# Patient Record
Sex: Male | Born: 1960 | Race: Black or African American | Hispanic: No | Marital: Married | Smoking: Never smoker
Health system: Southern US, Community
[De-identification: ages and names within clinical notes are randomized; demographics above are authoritative.]

## PROBLEM LIST (undated history)

## (undated) DIAGNOSIS — R42 Dizziness and giddiness: Secondary | ICD-10-CM

## (undated) DIAGNOSIS — E119 Type 2 diabetes mellitus without complications: Secondary | ICD-10-CM

## (undated) DIAGNOSIS — M791 Myalgia, unspecified site: Secondary | ICD-10-CM

## (undated) HISTORY — DX: Myalgia, unspecified site: M79.10

## (undated) HISTORY — DX: Dizziness and giddiness: R42

## (undated) HISTORY — PX: ACHILLES TENDON REPAIR: SUR1153

---

## 2017-04-03 ENCOUNTER — Emergency Department (HOSPITAL_COMMUNITY): Payer: POS

## 2017-04-03 ENCOUNTER — Emergency Department (HOSPITAL_COMMUNITY)
Admission: EM | Admit: 2017-04-03 | Discharge: 2017-04-04 | Disposition: A | Discharge status: Home / Self Care | Payer: POS | Attending: Emergency Medicine | Admitting: Emergency Medicine

## 2017-04-03 ENCOUNTER — Encounter (HOSPITAL_COMMUNITY): Payer: Self-pay | Admitting: Emergency Medicine

## 2017-04-03 DIAGNOSIS — E119 Type 2 diabetes mellitus without complications: Secondary | ICD-10-CM | POA: Insufficient documentation

## 2017-04-03 DIAGNOSIS — F319 Bipolar disorder, unspecified: Secondary | ICD-10-CM | POA: Diagnosis not present

## 2017-04-03 DIAGNOSIS — F22 Delusional disorders: Secondary | ICD-10-CM | POA: Diagnosis not present

## 2017-04-03 HISTORY — DX: Dizziness and giddiness: R42

## 2017-04-03 HISTORY — DX: Type 2 diabetes mellitus without complications: E11.9

## 2017-04-03 LAB — CBC WITH DIFFERENTIAL/PLATELET
BASOS PCT: 0 %
Basophils Absolute: 0 10*3/uL (ref 0.0–0.1)
EOS ABS: 0.1 10*3/uL (ref 0.0–0.7)
EOS PCT: 1 %
HCT: 41.4 % (ref 39.0–52.0)
HEMOGLOBIN: 13.8 g/dL (ref 13.0–17.0)
LYMPHS ABS: 2.6 10*3/uL (ref 0.7–4.0)
Lymphocytes Relative: 32 %
MCH: 29 pg (ref 26.0–34.0)
MCHC: 33.3 g/dL (ref 30.0–36.0)
MCV: 87 fL (ref 78.0–100.0)
MONO ABS: 0.5 10*3/uL (ref 0.1–1.0)
MONOS PCT: 6 %
Neutro Abs: 4.9 10*3/uL (ref 1.7–7.7)
Neutrophils Relative %: 61 %
Platelets: 344 10*3/uL (ref 150–400)
RBC: 4.76 MIL/uL (ref 4.22–5.81)
RDW: 12.6 % (ref 11.5–15.5)
WBC: 8 10*3/uL (ref 4.0–10.5)

## 2017-04-03 LAB — COMPREHENSIVE METABOLIC PANEL
ALK PHOS: 57 U/L (ref 38–126)
ALT: 29 U/L (ref 17–63)
ANION GAP: 8 (ref 5–15)
AST: 20 U/L (ref 15–41)
Albumin: 3.7 g/dL (ref 3.5–5.0)
BILIRUBIN TOTAL: 0.8 mg/dL (ref 0.3–1.2)
BUN: 18 mg/dL (ref 6–20)
CALCIUM: 8.7 mg/dL — AB (ref 8.9–10.3)
CO2: 27 mmol/L (ref 22–32)
CREATININE: 1.03 mg/dL (ref 0.61–1.24)
Chloride: 101 mmol/L (ref 101–111)
GFR calc Af Amer: >60 mL/min (ref >60)
GFR calc non Af Amer: >60 mL/min (ref >60)
Glucose, Bld: 244 mg/dL — ABNORMAL HIGH (ref 65–99)
Potassium: 3.7 mmol/L (ref 3.5–5.1)
SODIUM: 136 mmol/L (ref 135–145)
TOTAL PROTEIN: 7.2 g/dL (ref 6.5–8.1)

## 2017-04-03 LAB — CBG MONITORING, ED: Glucose-Capillary: 185 mg/dL — ABNORMAL HIGH (ref 65–99)

## 2017-04-03 LAB — AMMONIA: AMMONIA: 24 umol/L (ref 9–35)

## 2017-04-03 LAB — ETHANOL: Alcohol, Ethyl (B): <10 mg/dL (ref <10)

## 2017-04-03 MED ORDER — ALUM & MAG HYDROXIDE-SIMETH 200-200-20 MG/5ML PO SUSP: 30.0000 mL | Freq: Four times a day (QID) | ORAL | Status: DC | PRN

## 2017-04-03 MED ORDER — ZOLPIDEM TARTRATE 5 MG PO TABS: 5.0000 mg | ORAL_TABLET | Freq: Every evening | ORAL | Status: DC | PRN

## 2017-04-03 MED ORDER — ACETAMINOPHEN 325 MG PO TABS: 650.0000 mg | ORAL_TABLET | ORAL | Status: DC | PRN

## 2017-04-03 MED ORDER — ONDANSETRON HCL 4 MG PO TABS: 4.0000 mg | ORAL_TABLET | Freq: Three times a day (TID) | ORAL | Status: DC | PRN

## 2017-04-03 NOTE — ED Notes (Signed)
Pharm Tech at bedside to review medications.

## 2017-04-03 NOTE — BH Assessment (Addendum)
**Note De-Identified Rangel Obfuscation** Tele Assessment Note   Patient Name: George GingerRonald Strout Sr. MRN: [id number] Referring Physician: Arthor CaptainAbigail Harris PA-C Location of [facility]: [facility]  George Gingeronald Marques Sr. is an 56 y.o. male. [facility] He is pleasant and oriented x 4. He says he and his fiancee drove around today trying to get him assessed and someone [facility]. He denies hx of outpatient or inpatient MH treatment. Pt sts he was recently prescribed psych meds by his PCP, [facility] at [facility]. He says he spoke w/ a George Rangel at the post office who is "like a Public relations account executiveguidance counselor". Pt reports lately he has been hearing air blowing in his L ear and hearing whistling. He says when he tries to read, his mind wanders which is a fairly new phenomenon. 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 He says there were kids in the street and angry men. He says that there were people with [place] sports team shirts (pt lived in [place] for 15 yrs) and he was wondering if they were trying to tell him something. Pt says he wasn't sure if what he was seeing was real. He says that lately landmarks are not where they typically are on his route. He says he wonders if the post office is out to get him. He says "strange things have been happening lately." Pt says that an old lady has been giving him candy and a few male coworkers have been patting his back and asking about his weight loss. (Pt says he has lost 24 lbs in 4 weeks b/c when he is upset, he doesn't eat). He says his coworker told him that all pt's talking is bothering the other workers, and pt is worried that he is bothering them. Pt reports depressed mood but only endorses guilt. He says he feels guilt for leaving his wife and moving to [place] 6 years ago. He says his kids tell him that he is a bad father.  Pt reports his 718 yo son told him that he needs $8,000 for next semester at [employer] He says he  was dx with diabetes approx one month ago. Pt says his sister who is [id number] has been hearing voices for the past 15 yrs. Pt denies SI currently or at any time in the past. Pt denies any history of suicide attempts and denies history of self-mutilation. Pt denies hallucinations. Pt does not appear to be responding to internal stimuli. [name] reality testing appears to be intact. Pt denies homicidal thoughts or physical aggression. Pt denies having access to firearms. Pt denies having any legal problems at this time. Pt denies any current or past substance abuse problems. Pt does not appear to be intoxicated or in withdrawal at this time.   Diagnosis: Mild Neurocognitive Disorder  Past Medical History:  Past Medical History:  Diagnosis Date  . Diabetes mellitus without complication (HCC)   . Vertigo     History reviewed. No pertinent surgical history.  Family History: No family history on file.  Social History:  reports that  has never smoked. he has never used smokeless tobacco. He reports that he does not drink alcohol or use drugs.  Additional Social History:  Alcohol / Drug Use Pain Medications: pt denies abuse - see pta meds lsit Prescriptions: pt denies abuse - see pta meds list Over the Counter: pt denies abuse - see pta meds list History  of alcohol / drug use?: No history of alcohol / drug abuse Longest period of sobriety (when/how long): n/a  CIWA: CIWA-Ar BP: 137/71 Pulse Rate: 67 COWS:    PATIENT STRENGTHS: (choose at least two) Average or above average intelligence Capable of independent living Communication skills Motivation for treatment/growth Physical Health Supportive family/friends Work skills  Allergies: No Known Allergies  Home Medications:  (Not in a hospital admission)  OB/GYN Status:  No LMP for male patient.  General Assessment Data [facility] of Assessment: [facility] Assessment: In system Is this a Tele or Face-to-Face Assessment?: Tele Assessment Is  this an Initial Assessment or a Re-assessment for this encounter?: Initial Assessment Marital status: (fiancee) Maiden name: none Is patient pregnant?: No Pregnancy Status: No Living Arrangements: Children, Spouse/significant other(fiancee, 40 yo daughter) Can pt return to current living arrangement?: Yes Admission Status: Voluntary Is patient capable of signing voluntary admission?: Yes Referral Source: Self/Family/Friend Insurance type: self pay     Crisis Care Plan Living Arrangements: Children, Spouse/significant other(fiancee, 92 yo daughter) Legal Guardian: (patient) Name of Psychiatrist: none Name of Therapist: none  Education Status Is patient currently in school?: No  Risk to self with the past 6 months Suicidal Ideation: No Has patient been a risk to self within the past 6 months prior to admission? : No Suicidal Intent: No Has patient had any suicidal intent within the past 6 months prior to admission? : No Is patient at risk for suicide?: No Suicidal Plan?: No Has patient had any suicidal plan within the past 6 months prior to admission? : No Access to Means: No What has been your use of drugs/alcohol within the last 12 months?: none Previous Attempts/Gestures: No How many times?: 0 Other Self Harm Risks: none Triggers for Past Attempts: (n/a) Intentional Self Injurious Behavior: None Family Suicide History: No Recent stressful life event(s): Other (Comment), Financial Problems(work, need $8000 to pay pt's next semester) Persecutory voices/beliefs?: No Depression: No Depression Symptoms: Guilt Substance abuse history and/or treatment for substance abuse?: No Suicide prevention information given to non-admitted patients: Not applicable  Risk to Others within the past 6 months Homicidal Ideation: No Does patient have any lifetime risk of violence toward others beyond the six months prior to admission? : No Thoughts of Harm to Others: No Current Homicidal  Intent: No Current Homicidal Plan: No Access to Homicidal Means: No Identified Victim: none History of harm to others?: No Assessment of Violence: None Noted Violent Behavior Description: pt denies hx violence Does patient have access to weapons?: No Criminal Charges Pending?: No Does patient have a court date: No Is patient on probation?: No  Psychosis Hallucinations: None noted Delusions: Persecutory(post office may be testing him)  Mental Status Report Appearance/Hygiene: Unremarkable, In scrubs Eye Contact: Good Motor Activity: Freedom of movement Speech: Logical/coherent Level of Consciousness: Alert Mood: Depressed, Anxious, Sad Affect: Appropriate to circumstance Anxiety Level: Moderate Thought Processes: Relevant, Coherent Judgement: Partial Orientation: Person, Place, Time, Situation Obsessive Compulsive Thoughts/Behaviors: None  Cognitive Functioning Concentration: Decreased Memory: Remote Intact, Recent Impaired IQ: Average Insight: Fair Impulse Control: Fair Appetite: Poor Weight Loss: 25(in 4 weeks) Sleep: No Change Total Hours of Sleep: 5 Vegetative Symptoms: None  ADLScreening [facility] Assessment Services) Patient's cognitive ability adequate to safely complete daily activities?: Yes Patient able to express need for assistance with ADLs?: Yes Independently performs ADLs?: Yes (appropriate for developmental age)  Prior Inpatient Therapy Prior Inpatient Therapy: No  Prior Outpatient Therapy Prior Outpatient Therapy: No Does patient have an ACCT  team?: No Does patient have Intensive In-House Services?  : No Does patient have [facility] services? : No Does patient have P4CC services?: No  ADL Screening (condition at time of admission) Patient's cognitive ability adequate to safely complete daily activities?: Yes Is the patient deaf or have difficulty hearing?: No Does the patient have difficulty seeing, even when wearing glasses/contacts?: No Does the  patient have difficulty concentrating, remembering, or making decisions?: Yes Patient able to express need for assistance with ADLs?: Yes Does the patient have difficulty dressing or bathing?: No Independently performs ADLs?: Yes (appropriate for developmental age) Does the patient have difficulty walking or climbing stairs?: No Weakness of Legs: None Weakness of Arms/Hands: None  Home Assistive Devices/Equipment Home Assistive Devices/Equipment: None    Abuse/Neglect Assessment (Assessment to be complete while patient is alone) Physical Abuse: Denies Verbal Abuse: Denies Sexual Abuse: Denies Exploitation of patient/patient's resources: Denies Self-Neglect: Denies     Merchant navy officerAdvance Directives (For Healthcare) Does Patient Have a Medical Advance Directive?: No Would patient like information on creating a medical advance directive?: No - Patient declined    Additional Information 1:1 In Past 12 Months?: No CIRT Risk: No Elopement Risk: No Does patient have medical clearance?: Yes   Disposition:  Disposition Initial Assessment Completed for this Encounter: Yes Disposition of Patient: Re-evaluation by Psychiatry recommended(jason berry np)   Nira ConnJason Berry NP recommends pt be continuously observed for safety. Pt to be re-evaluated by psychiatry in am.   This service was provided Rangel telemedicine using a 2-way, interactive audio and video technology.  Names of all persons participating in this telemedicine service and their role in this encounter. Name: Okey Regalarol RN [name] RN  Windy Fastonald Dore jr pt  [facility]oline Dalexa Gentz assessor       Thornell SartoriusMCLEAN, Nilah Belcourt P 04/03/2017 9:57 PM

## 2017-04-03 NOTE — ED Notes (Signed)
Labs drawn

## 2017-04-03 NOTE — ED Provider Notes (Signed)
[facility]-EMERGENCY DEPT Provider Note   CSN: [id number] Arrival date & time: 04/03/17  1737     History   Chief Complaint Chief Complaint  Patient presents with  . Paranoid    HPI George GingerRonald Rhue Sr. is a 56 y.o. male.  Who presents the emergency department with paranoia and delusional thinking.  Patient has no past history of any psychiatric illness.  He does endorse undiagnosed depression.  Patient states that he has been feeling depressed for several months.  Recently he has been under significant stress because he needs to come up with about $8000 for his son to go to school next semester.  He states that things have been abnormal at work and he has been under stress.  He is recently diagnosed with diabetes.  His wife sits with him and gives some of the history.  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  He states that normally some of the older women come out to talk to him when he hands are male however he felt like they were hiding behind the drawer and snatching the mail away.  Patient also states that he saw people [place] and he felt like they were trying to tell him something.  He also feels that the federal government is out to get him.  His wife states he had a 25 pound weight loss in the past 4 weeks.  He states that he is too stressed out to eat.  Denies headaches, visual changes, night sweats.  He denies alcohol or drug abuse.  The patient was started on Lexapro 5 days ago.  This was after he began endorsing paranoid ideation.  HPI  Past Medical History:  Diagnosis Date  . Diabetes mellitus without complication (HCC)   . Vertigo     There are no active problems to display for this  patient.   History reviewed. No pertinent surgical history.     Home Medications    Prior to Admission medications   Not on File    Family History No family history on file.  Social History Social History   Tobacco Use  . Smoking status: Never Smoker  . Smokeless tobacco: Never Used  Substance Use Topics  . Alcohol use: No    Frequency: Never  . Drug use: No     Allergies   Patient has no allergy information on record.   Review of Systems Review of Systems Ten systems reviewed and are negative for acute change, except as noted in the HPI.    Physical Exam Updated Vital Signs BP 140/88   Pulse 71   Temp 98.2 F (36.8 C)   Resp 16   Ht 6\' 1"  (1.854 m)   Wt 112 kg (247 lb)   SpO2 99%   BMI 32.59 kg/m   Physical Exam  Constitutional: He is oriented to person, place, and time. He appears well-developed and well-nourished. No distress.  HENT:  Head: Normocephalic and atraumatic.  Eyes: Conjunctivae are normal. No scleral icterus.  Neck: Normal range of motion. Neck supple.  Cardiovascular: Normal rate, regular rhythm and normal heart sounds.  Pulmonary/Chest: Effort normal and breath sounds normal. No respiratory distress.  Abdominal:  Soft. There is no tenderness.  Musculoskeletal: He exhibits no edema.  Neurological: He is alert and oriented to person, place, and time.  Skin: Skin is warm and dry. He is not diaphoretic.  Psychiatric: His behavior is normal.  Nursing note and vitals reviewed.    ED Treatments / Results  Labs (all labs ordered are listed, but only abnormal results are displayed) Labs Reviewed  COMPREHENSIVE METABOLIC PANEL  ETHANOL  RAPID URINE DRUG SCREEN, HOSP PERFORMED  CBC WITH DIFFERENTIAL/PLATELET  URINALYSIS, ROUTINE W REFLEX MICROSCOPIC  AMMONIA  CBG MONITORING, ED    EKG  EKG Interpretation None       Radiology No results found.  Procedures Procedures (including critical care time)  Medications Ordered  in ED Medications - No data to display   Initial Impression / Assessment and Plan / ED Course  I have reviewed the triage vital signs and the nursing notes.  Pertinent labs & imaging results that were available during my care of the patient were reviewed by me and considered in my medical decision making (see chart for details).     Patient with new onset paranoia and delusions.  His medical workup is negative and he appears medically clear for psychiatric evaluation.  Patient is here voluntarily and is cooperative.  Final Clinical Impressions(s) / ED Diagnoses   Final diagnoses:  Paranoia [facility](HCC)  Delusional disorder [facility](HCC)    ED Discharge Orders    None       Arthor CaptainHarris, Viliami Bracco, PA-C 04/04/17 [id number]    Doug SouJacubowitz, Sam, MD 04/04/17 1714

## 2017-04-03 NOTE — ED Notes (Signed)
[facility]PsychAss[facility] in progress at bedside.

## 2017-04-03 NOTE — ED Triage Notes (Signed)
Pleasant, cooperative adult male arrived voluntary with his spouse.  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  He reports recently having strange thoughts such as people are following him or watching him.  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  Denies any homicidal or suicidal ideation.  Recently started on trazodone and escitalopram.  Takes meclizine as needed for vertigo.  Patient reports he does not drink, take drugs, or smoke.

## 2017-04-03 NOTE — ED Notes (Signed)
Pt taken to Xray via WC by Radiology Tech.

## 2017-04-04 DIAGNOSIS — G4700 Insomnia, unspecified: Secondary | ICD-10-CM

## 2017-04-04 DIAGNOSIS — Z818 Family history of other mental and behavioral disorders: Secondary | ICD-10-CM

## 2017-04-04 DIAGNOSIS — F3132 Bipolar disorder, current episode depressed, moderate: Secondary | ICD-10-CM

## 2017-04-04 DIAGNOSIS — F319 Bipolar disorder, unspecified: Secondary | ICD-10-CM | POA: Diagnosis present

## 2017-04-04 LAB — RAPID URINE DRUG SCREEN, HOSP PERFORMED
Amphetamines: NOT DETECTED
BARBITURATES: NOT DETECTED
BENZODIAZEPINES: NOT DETECTED
Cocaine: NOT DETECTED
Opiates: NOT DETECTED
TETRAHYDROCANNABINOL: NOT DETECTED

## 2017-04-04 LAB — URINALYSIS, ROUTINE W REFLEX MICROSCOPIC
BILIRUBIN URINE: NEGATIVE
Glucose, UA: NEGATIVE mg/dL
HGB URINE DIPSTICK: NEGATIVE
KETONES UR: NEGATIVE mg/dL
Leukocytes, UA: NEGATIVE
Nitrite: NEGATIVE
PROTEIN: NEGATIVE mg/dL
Specific Gravity, Urine: 1.029 (ref 1.005–1.030)
pH: 5 (ref 5.0–8.0)

## 2017-04-04 LAB — CBG MONITORING, ED: GLUCOSE-CAPILLARY: 128 mg/dL — AB (ref 65–99)

## 2017-04-04 MED ORDER — CARBAMAZEPINE 200 MG PO TABS: ORAL_TABLET | ORAL | 0 refills | Status: DC

## 2017-04-04 MED ORDER — HALOPERIDOL 1 MG PO TABS: 1.0000 mg | ORAL_TABLET | Freq: Two times a day (BID) | ORAL | Status: DC

## 2017-04-04 MED ORDER — CARBAMAZEPINE 200 MG PO TABS
200.0000 mg | ORAL_TABLET | Freq: Two times a day (BID) | ORAL | Status: DC
  Administered 2017-04-04: 200 mg via ORAL
  Filled 2017-04-04: qty 1

## 2017-04-04 MED ORDER — METFORMIN HCL 500 MG PO TABS: 500.0000 mg | ORAL_TABLET | Freq: Two times a day (BID) | ORAL | 0 refills | Status: AC

## 2017-04-04 MED ORDER — TRAZODONE HCL 100 MG PO TABS: 100.0000 mg | ORAL_TABLET | Freq: Every evening | ORAL | Status: DC | PRN

## 2017-04-04 MED ORDER — HALOPERIDOL 2 MG PO TABS
2.0000 mg | ORAL_TABLET | Freq: Two times a day (BID) | ORAL | Status: DC
  Administered 2017-04-04: 2 mg via ORAL
  Filled 2017-04-04: qty 1

## 2017-04-04 MED ORDER — MECLIZINE HCL 25 MG PO TABS
25.0000 mg | ORAL_TABLET | Freq: Three times a day (TID) | ORAL | Status: DC | PRN
  Administered 2017-04-04: 25 mg via ORAL
  Filled 2017-04-04: qty 1

## 2017-04-04 MED ORDER — LIVING WELL WITH DIABETES BOOK
Freq: Once | Status: AC
  Administered 2017-04-04: 17:00:00
  Filled 2017-04-04: qty 1

## 2017-04-04 NOTE — ED Notes (Signed)
Pt started on a new medication and will stay for 1 more night.  Spoke to girlfriend, she will come visit later this afternoon

## 2017-04-04 NOTE — ED Notes (Signed)
Bed: WBH40 Expected date:  Expected time:  Means of arrival:  Comments: Hold for 26 

## 2017-04-04 NOTE — Progress Notes (Signed)
04/04/17 1350:  Pt was making multiple calls.   George RancherMarjette Carsin Randazzo, LRT/CTRS

## 2017-04-04 NOTE — ED Notes (Signed)
Pt does not feel comfortable here and would like to leave. He does not understand exactly why he is here. He admits to having alterations in thought, but feels that it might be the medication causing it. Denies SI/HI/AVH. Does not appear to be responding to internal stimuli. 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

## 2017-04-04 NOTE — ED Provider Notes (Signed)
I was asked to see patient as he is requesting discharge. He is here voluntarily. He has undergone psychiatric evaluation and an stabilized on medications. He's been here for 36 hours. Newly diagnosed non-insulin dependent diabetic due to high resting blood sugars and elevated A1c at his primary care. Was placed on Lexapro within the last few weeks. Became acutely psychotic and has now been diagnosed as bipolar. Was given Haldol twice a day, Tegretol is stabilizer, trazodone for sleep.  He is here accompanied by his fiance. He is awake and alert oriented lucid. He denies any more paranoia. She feels his behavior is normal. I reviewed notes from psychiatry staff and NP. Plan on discharge with daily metformin. He's been seen by diabetic educator. He has trazodone at home 100 mg for sleep. He has his Lexapro at home. We'll continue on his mood stabilizer Tegretol twice a day. Given [facility] as outpatient referral for ongoing psychiatric care.   Rolland PorterJames, Kaeya Schiffer, MD 04/04/17 2040

## 2017-04-04 NOTE — ED Notes (Signed)
Pt. Talking to visitor and MHT.

## 2017-04-04 NOTE — ED Notes (Signed)
Pt. With visitor.

## 2017-04-04 NOTE — Consult Note (Signed)
[facility] Consult   Reason for Consult:  Paranoia, insomnia Referring Physician:  EDP Patient Identification: George Numbers Sr. MRN:  [id number] Principal Diagnosis: Bipolar affective disorder, currently active [facility]) Diagnosis:   Patient Active Problem List   Diagnosis Date Noted  . Bipolar affective disorder, currently active [facility]) [F31.9] 04/04/2017    Priority: High    Total Time spent with patient: 45 minutes  Subjective:   George Numbers Sr. is a 56 y.o. male patient was started on medications and observed for safetty.  HPI:  56 yo male who presented to the ED with confusion and paranoia along with insomnia.  These symptoms started when he was started on Lexapro about 5 -6 days ago, activating his mood disorder.  Medications were adjusted to treat bipolar disorder vs depression.  He was concerned about his new onset diabetes, diabetic consult placed.  Agreeable to medication changes and observation.  Past Psychiatric History: depression  Risk to Self: Suicidal Ideation: No Suicidal Intent: No Is patient at risk for suicide?: No Suicidal Plan?: No Access to Means: No What has been your use of drugs/alcohol within the last 12 months?: none How many times?: 0 Other Self Harm Risks: none Triggers for Past Attempts: (n/a) Intentional Self Injurious Behavior: None Risk to Others: Homicidal Ideation: No Thoughts of Harm to Others: No Current Homicidal Intent: No Current Homicidal Plan: No Access to Homicidal Means: No Identified Victim: none History of harm to others?: No Assessment of Violence: None Noted Violent Behavior Description: pt denies hx violence Does patient have access to weapons?: No Criminal Charges Pending?: No Does patient have a court date: No Prior Inpatient Therapy: Prior Inpatient Therapy: No Prior Outpatient Therapy: Prior Outpatient Therapy: No Does patient have an ACCT team?: No Does patient have Intensive In-House Services?  :  No Does patient have [facility] services? : No Does patient have [facility] services?: No  Past Medical History:  Past Medical History:  Diagnosis Date  . Diabetes mellitus without complication ([place])   . Vertigo    History reviewed. No pertinent surgical history. Family History: No family history on file. Family Psychiatric  History: sister with bipolar disorder Social History:  Social History   Substance and Sexual Activity  Alcohol Use No  . Frequency: Never     Social History   Substance and Sexual Activity  Drug Use No    Social History   Socioeconomic History  . Marital status: Single    Spouse name: None  . Number of children: None  . Years of education: None  . Highest education level: None  Social Needs  . Financial resource strain: None  . Food insecurity - worry: None  . Food insecurity - inability: None  . Transportation needs - medical: None  . Transportation needs - non-medical: None  Occupational History  . None  Tobacco Use  . Smoking status: Never Smoker  . Smokeless tobacco: Never Used  Substance and Sexual Activity  . Alcohol use: No    Frequency: Never  . Drug use: No  . Sexual activity: None  Other Topics Concern  . None  Social History Narrative  . None   Additional Social History:    Allergies:  No Known Allergies  Labs:  Results for orders placed or performed during the hospital encounter of 04/03/17 (from the past 48 hour(s))  Comprehensive metabolic panel     Status: Abnormal   Collection Time: 04/03/17  7:40 PM  Result Value Ref Range   Sodium  136 135 - 145 mmol/L   Potassium 3.7 3.5 - 5.1 mmol/L   Chloride 101 101 - 111 mmol/L   CO2 27 22 - 32 mmol/L   Glucose, Bld 244 (H) 65 - 99 mg/dL   BUN 18 6 - 20 mg/dL   Creatinine, Ser 1.03 0.61 - 1.24 mg/dL   Calcium 8.7 (L) 8.9 - 10.3 mg/dL   Total Protein 7.2 6.5 - 8.1 g/dL   Albumin 3.7 3.5 - 5.0 g/dL   AST 20 15 - 41 U/L   ALT 29 17 - 63 U/L   Alkaline Phosphatase 57 38 - 126 U/L    Total Bilirubin 0.8 0.3 - 1.2 mg/dL   GFR calc non Af Amer >60 >60 mL/min   GFR calc Af Amer >60 >60 mL/min    Comment: (NOTE) The eGFR has been calculated using the CKD EPI equation. This calculation has not been validated in all clinical situations. eGFR's persistently <60 mL/min signify possible Chronic Kidney Disease.    Anion gap 8 5 - 15  Ethanol     Status: None   Collection Time: 04/03/17  7:40 PM  Result Value Ref Range   Alcohol, Ethyl (B) <10 <10 mg/dL    Comment:        LOWEST DETECTABLE LIMIT FOR SERUM ALCOHOL IS 10 mg/dL FOR MEDICAL PURPOSES ONLY   CBC with Diff     Status: None   Collection Time: 04/03/17  7:40 PM  Result Value Ref Range   WBC 8.0 4.0 - 10.5 K/uL   RBC 4.76 4.22 - 5.81 MIL/uL   Hemoglobin 13.8 13.0 - 17.0 g/dL   HCT 41.4 39.0 - 52.0 %   MCV 87.0 78.0 - 100.0 fL   MCH 29.0 26.0 - 34.0 pg   MCHC 33.3 30.0 - 36.0 g/dL   RDW 12.6 11.5 - 15.5 %   Platelets 344 150 - 400 K/uL   Neutrophils Relative % 61 %   Neutro Abs 4.9 1.7 - 7.7 K/uL   Lymphocytes Relative 32 %   Lymphs Abs 2.6 0.7 - 4.0 K/uL   Monocytes Relative 6 %   Monocytes Absolute 0.5 0.1 - 1.0 K/uL   Eosinophils Relative 1 %   Eosinophils Absolute 0.1 0.0 - 0.7 K/uL   Basophils Relative 0 %   Basophils Absolute 0.0 0.0 - 0.1 K/uL  Ammonia     Status: None   Collection Time: 04/03/17  7:40 PM  Result Value Ref Range   Ammonia 24 9 - 35 umol/L  CBG monitoring, ED     Status: Abnormal   Collection Time: 04/03/17  9:06 PM  Result Value Ref Range   Glucose-Capillary 185 (H) 65 - 99 mg/dL  Urine rapid drug screen (hosp performed)     Status: None   Collection Time: 04/04/17  7:01 AM  Result Value Ref Range   Opiates NONE DETECTED NONE DETECTED   Cocaine NONE DETECTED NONE DETECTED   Benzodiazepines NONE DETECTED NONE DETECTED   Amphetamines NONE DETECTED NONE DETECTED   Tetrahydrocannabinol NONE DETECTED NONE DETECTED   Barbiturates NONE DETECTED NONE DETECTED    Comment:         DRUG SCREEN FOR MEDICAL PURPOSES ONLY.  IF CONFIRMATION IS NEEDED FOR ANY PURPOSE, NOTIFY LAB WITHIN 5 DAYS.        LOWEST DETECTABLE LIMITS FOR URINE DRUG SCREEN Drug Class       Cutoff (ng/mL) Amphetamine      1000 Barbiturate  200 Benzodiazepine   161 Tricyclics       096 Opiates          300 Cocaine          300 THC              50   Urinalysis, Routine w reflex microscopic     Status: None   Collection Time: 04/04/17  7:01 AM  Result Value Ref Range   Color, Urine YELLOW YELLOW   APPearance CLEAR CLEAR   Specific Gravity, Urine 1.029 1.005 - 1.030   pH 5.0 5.0 - 8.0   Glucose, UA NEGATIVE NEGATIVE mg/dL   Hgb urine dipstick NEGATIVE NEGATIVE   Bilirubin Urine NEGATIVE NEGATIVE   Ketones, ur NEGATIVE NEGATIVE mg/dL   Protein, ur NEGATIVE NEGATIVE mg/dL   Nitrite NEGATIVE NEGATIVE   Leukocytes, UA NEGATIVE NEGATIVE  CBG monitoring, ED     Status: Abnormal   Collection Time: 04/04/17  7:32 AM  Result Value Ref Range   Glucose-Capillary 128 (H) 65 - 99 mg/dL    Current Facility-Administered Medications  Medication Dose Route Frequency Provider Last Rate Last Dose  . acetaminophen (TYLENOL) tablet 650 mg  650 mg Oral Q4H PRN Harris, Abigail, PA-C      . alum & mag hydroxide-simeth (MAALOX/MYLANTA) 200-200-20 MG/5ML suspension 30 mL  30 mL Oral Q6H PRN Harris, Abigail, PA-C      . carbamazepine (TEGRETOL) tablet 200 mg  200 mg Oral BID PC Avriana Joo, MD      . haloperidol (HALDOL) tablet 1 mg  1 mg Oral BID Patrecia Pour, NP      . meclizine (ANTIVERT) tablet 25 mg  25 mg Oral TID PRN Patrecia Pour, NP   25 mg at 04/04/17 1500  . ondansetron (ZOFRAN) tablet 4 mg  4 mg Oral Q8H PRN Harris, Abigail, PA-C      . traZODone (DESYREL) tablet 100 mg  100 mg Oral QHS PRN Zalea Pete, MD       Current Outpatient Medications  Medication Sig Dispense Refill  . escitalopram (LEXAPRO) 10 MG tablet Take 10 mg daily by mouth.    . meclizine (ANTIVERT) 25 MG  tablet Take 25 mg 3 (three) times daily as needed by mouth for dizziness.    . naproxen sodium (ALEVE) 220 MG tablet Take 440 mg daily as needed by mouth (headache).    . traZODone (DESYREL) 100 MG tablet Take 100 mg at bedtime by mouth.      Musculoskeletal: Strength & Muscle Tone: within normal limits Gait & Station: normal Patient leans: N/A  Psychiatric Specialty Exam: Physical Exam  Constitutional: He is oriented to person, place, and time. He appears well-developed and well-nourished.  HENT:  Head: Normocephalic.  Neck: Normal range of motion.  Respiratory: Effort normal.  Musculoskeletal: Normal range of motion.  Neurological: He is alert and oriented to person, place, and time.  Psychiatric: His speech is normal and behavior is normal. Judgment normal. Thought content is paranoid. Cognition and memory are impaired. He exhibits a depressed mood.    Review of Systems  Psychiatric/Behavioral: Positive for depression. The patient has insomnia.   All other systems reviewed and are negative.   Blood pressure (!) 151/82, pulse 79, temperature 98.7 F (37.1 C), resp. rate 18, height 6' 1"  (1.854 m), weight 112 kg (247 lb), SpO2 99 %.Body mass index is 32.59 kg/m.  General Appearance: Casual  Eye Contact:  Good  Speech:  Normal Rate  Volume:  Normal  Mood:  Depressed  Affect:  Congruent  Thought Process:  Coherent and Descriptions of Associations: Intact  Orientation:  Full (Time, Place, and Person)  Thought Content:  Paranoid Ideation  Suicidal Thoughts:  No  Homicidal Thoughts:  No  Memory:  Immediate;   Fair Recent;   Fair Remote;   Fair  Judgement:  Impaired  Insight:  Fair  Psychomotor Activity:  Decreased  Concentration:  Concentration: Fair and Attention Span: Fair  Recall:  [employer] of Knowledge:  Fair  Language:  Good  Akathisia:  No  Handed:  Right  AIMS (if indicated):     Assets:  Housing Leisure Time Physical Health Resilience Social  Support Vocational/Educational  ADL's:  Intact  Cognition:  Impaired,  Mild  Sleep:        Treatment Plan Summary: Daily contact with patient to assess and evaluate symptoms and progress in treatment, Medication management and Plan bipolar affective disorder, depressed, mild:  -Crisis stabilization -Medication management:  Started Haldol 1 mg BID for psychosis and Tegretol 200 mg BID for mood stabilization, Trazodone 100 mg at bedtime for sleep along with Antivert PRN for dizziness -Individual counseling  Disposition: Supportive therapy provided about ongoing stressors.  Waylan Boga, NP 04/04/2017 5:19 PM  Patient seen face-to-face for psychiatric evaluation, chart reviewed and case discussed with the physician extender and developed treatment plan. Reviewed the information documented and agree with the treatment plan. Corena Pilgrim, MD

## 2017-04-04 NOTE — Discharge Instructions (Signed)
Do NOT take your Lexapro until you follow up with your primary care, or [facility]. Continue twice per day Tegretol for your bipolar disorder. Start twice per day metformin for your diabetes. [facility] as above. Continue trazodone 100 mg at night for sleep

## 2017-04-04 NOTE — ED Notes (Signed)
Pt. Requesting to leave. Dr. Fayrene FearingJames consulted. Will see pt.

## 2017-04-04 NOTE — ED Notes (Signed)
Patient alert and oriented, warm and dry, in no acute distress. Patient denies SI, HI, AVH and pain. Patient made aware of Q15 minute rounds and security cameras for their safety. Patient instructed to come to me with needs or concerns. 

## 2017-04-04 NOTE — Progress Notes (Signed)
Referral received due to "New Onset" DM.  Spoke with RN who states that patient is appropriate to talk to.  Ordered "Living Well with Diabetes" booklet for patient.  Visited with patient.  He states that "I don't know why I'm here or what the plan is".  We discussed the diagnosis of DM.  He states that he was called by his PCP 3 weeks ago and told that he has "early" diabetes.  He states that his whole family has DM.  He was very upset by the new diagnosis.  The MD called in medication, he thinks, but he did not pick it up.  We briefly discussed DM and I told him that his blood sugars are okay at this time.  I encouraged him to not worry about the DM at this time and that he could follow-up with primary MD for further work-up.  Attempted to encourage patient. He states that he does drink regular sodas sometimes and that he was willing to stop that.  Did not cover DM in much detail with patient as I do not think this is appropriate with his current state.  Discussed with RN.    Thanks,  Beryl MeagerJenny Makiyah Zentz, RN, BC-ADM Inpatient Diabetes Coordinator Pager [phone]

## 2018-04-06 IMAGING — CT CT HEAD W/O CM
3 of 4 series · 14 of 47 positions shown, 16 images · non-contrast
Comparison: None.

CLINICAL DATA: 56-year-old male with confusion.  No known injury.

EXAM:
CT HEAD WITHOUT CONTRAST
TECHNIQUE: Contiguous axial images were obtained from the base of the skull
through the vertex without intravenous contrast.

[Series 2: head w/o · axial · non-contrast · 0.45mm/px · z∈[+1386,+1506]mm · 8 of 30 slices shown, 10 images]
[im 3/30  brain]
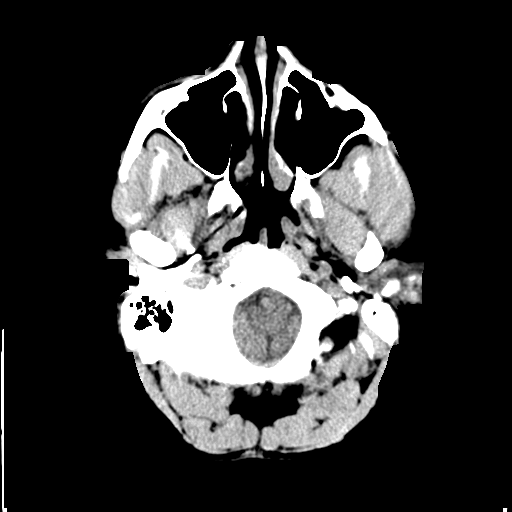
[im 3/30  bone]
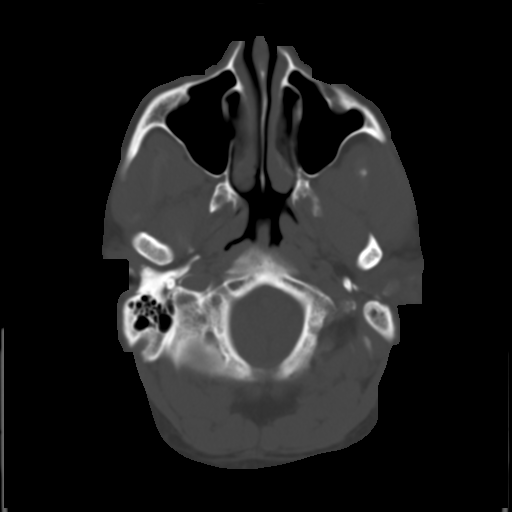
[im 7/30  brain]
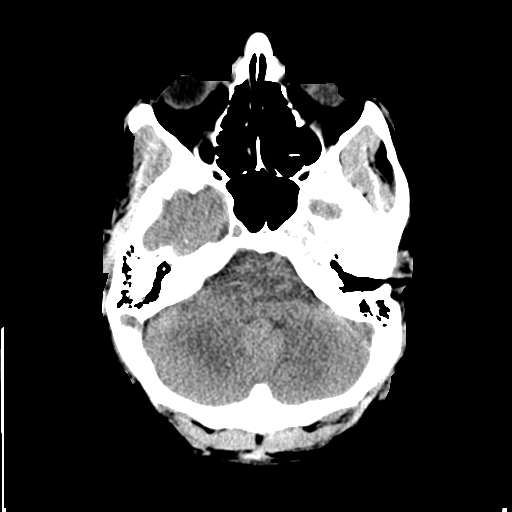
[im 11/30  brain]
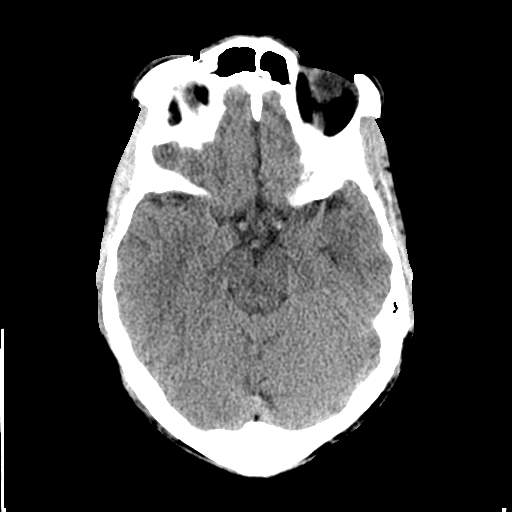
[im 13/30  brain]
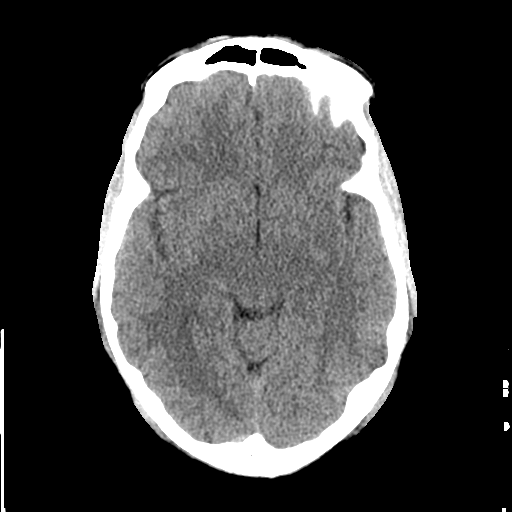
[im 17/30  brain]
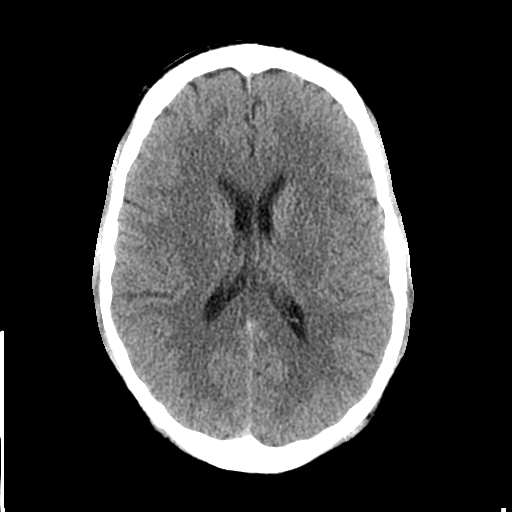
[im 17/30  bone]
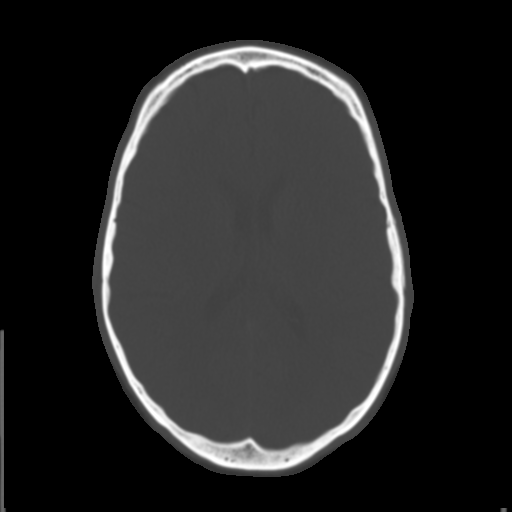
[im 19/30  brain]
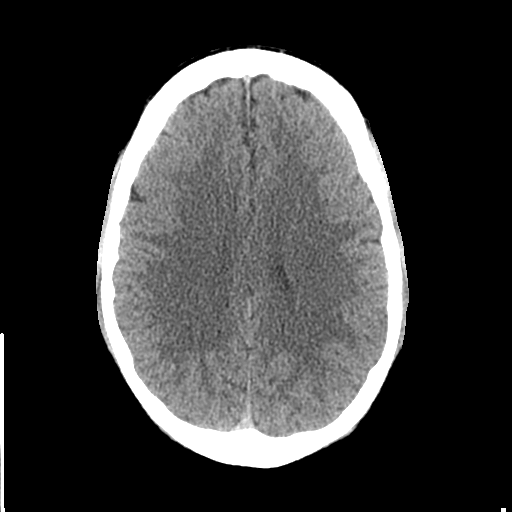
[im 23/30  brain]
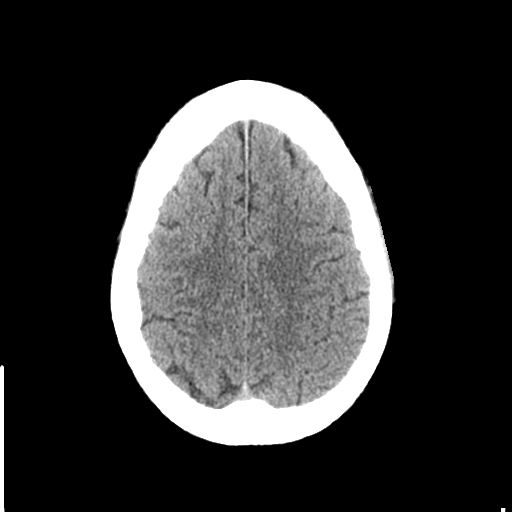
[im 27/30  brain]
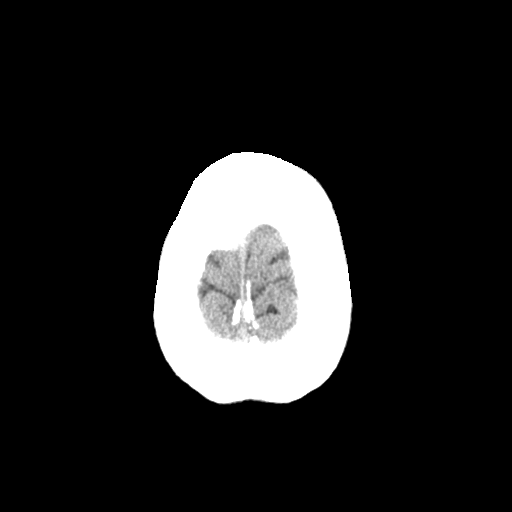

[Series 4: coronal · coronal · 0.33mm/px · 3 of 77 slices shown]
[im 26/77  brain]
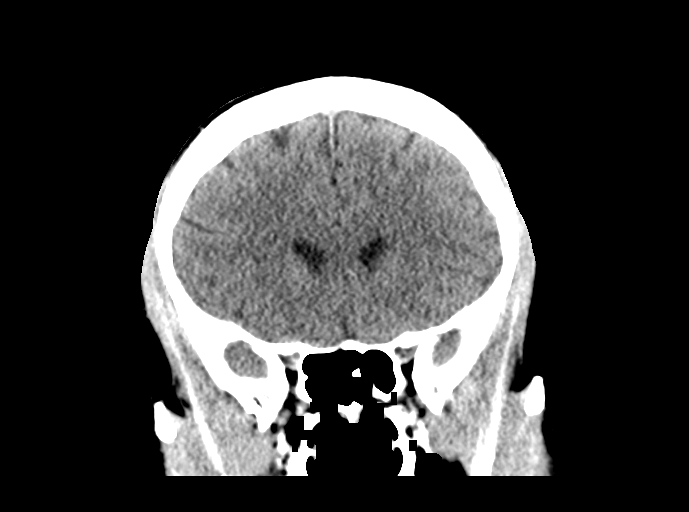
[im 34/77  brain]
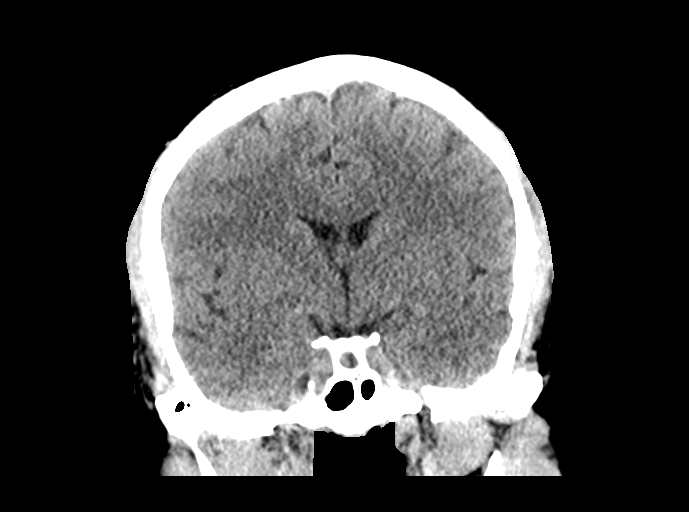
[im 43/77  brain]
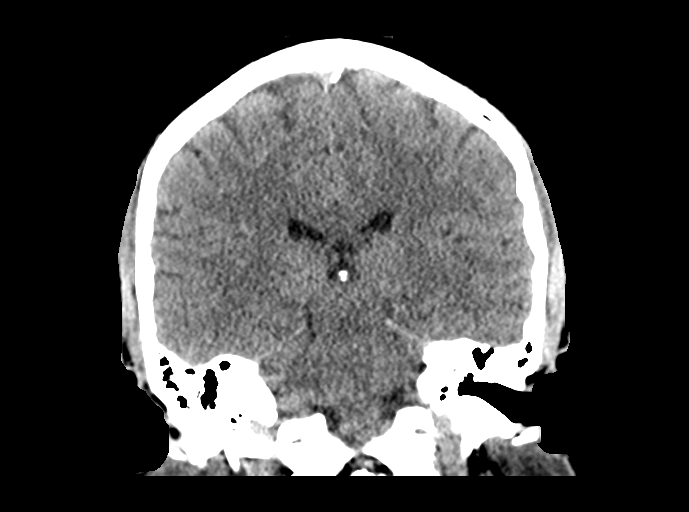

[Series 5: sagittal · sagittal · 0.29mm/px · 3 of 77 slices shown]
[im 26/77  brain]
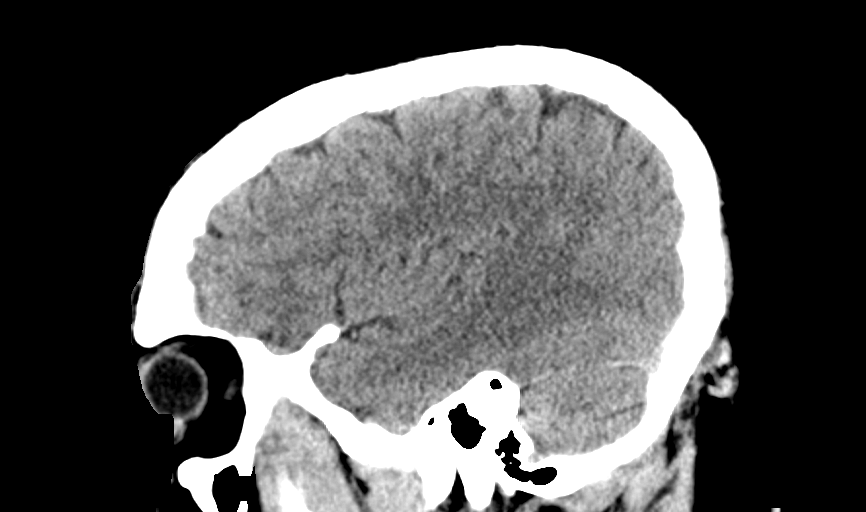
[im 39/77  brain]
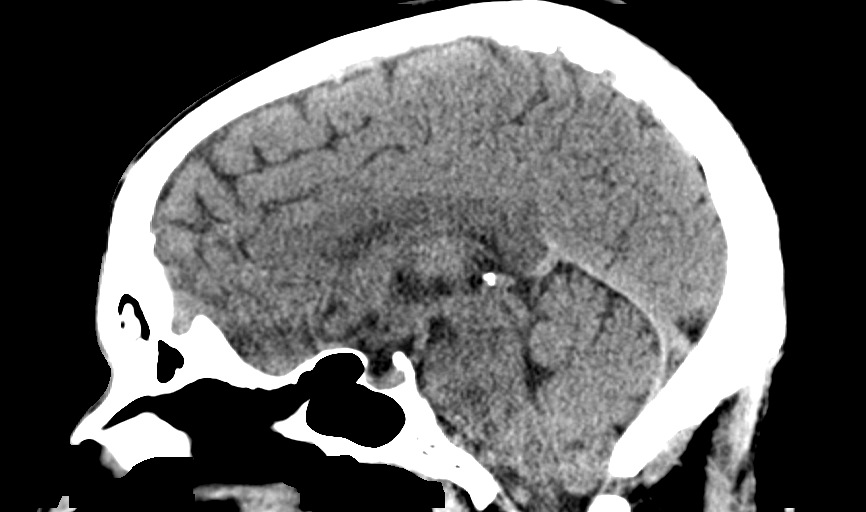
[im 51/77  brain]
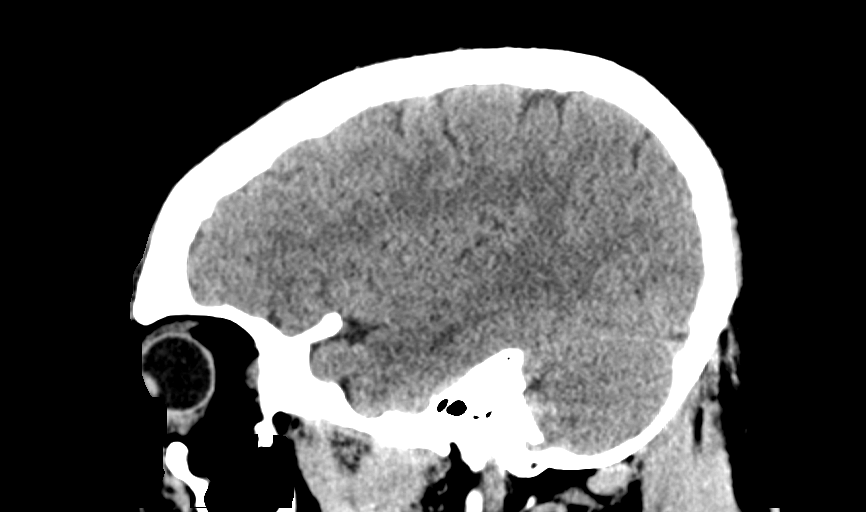

[14 of 47 positions shown; findings below may reference images not displayed]

FINDINGS: Brain: No evidence of acute infarction, hemorrhage, hydrocephalus,
extra-axial collection or mass lesion/mass effect.

Vascular: No hyperdense vessel or unexpected calcification.

Skull: Normal. Negative for fracture or focal lesion.

Sinuses/Orbits: No acute finding.

Other: None
IMPRESSION: Normal noncontrast CT of the brain.

## 2018-04-06 IMAGING — CR DG CHEST 2V
2 series · 2 of 2 positions shown · non-contrast
Comparison: None.

CLINICAL DATA: Altered mental status.

EXAM:
CHEST  2 VIEW

[w chest pa]
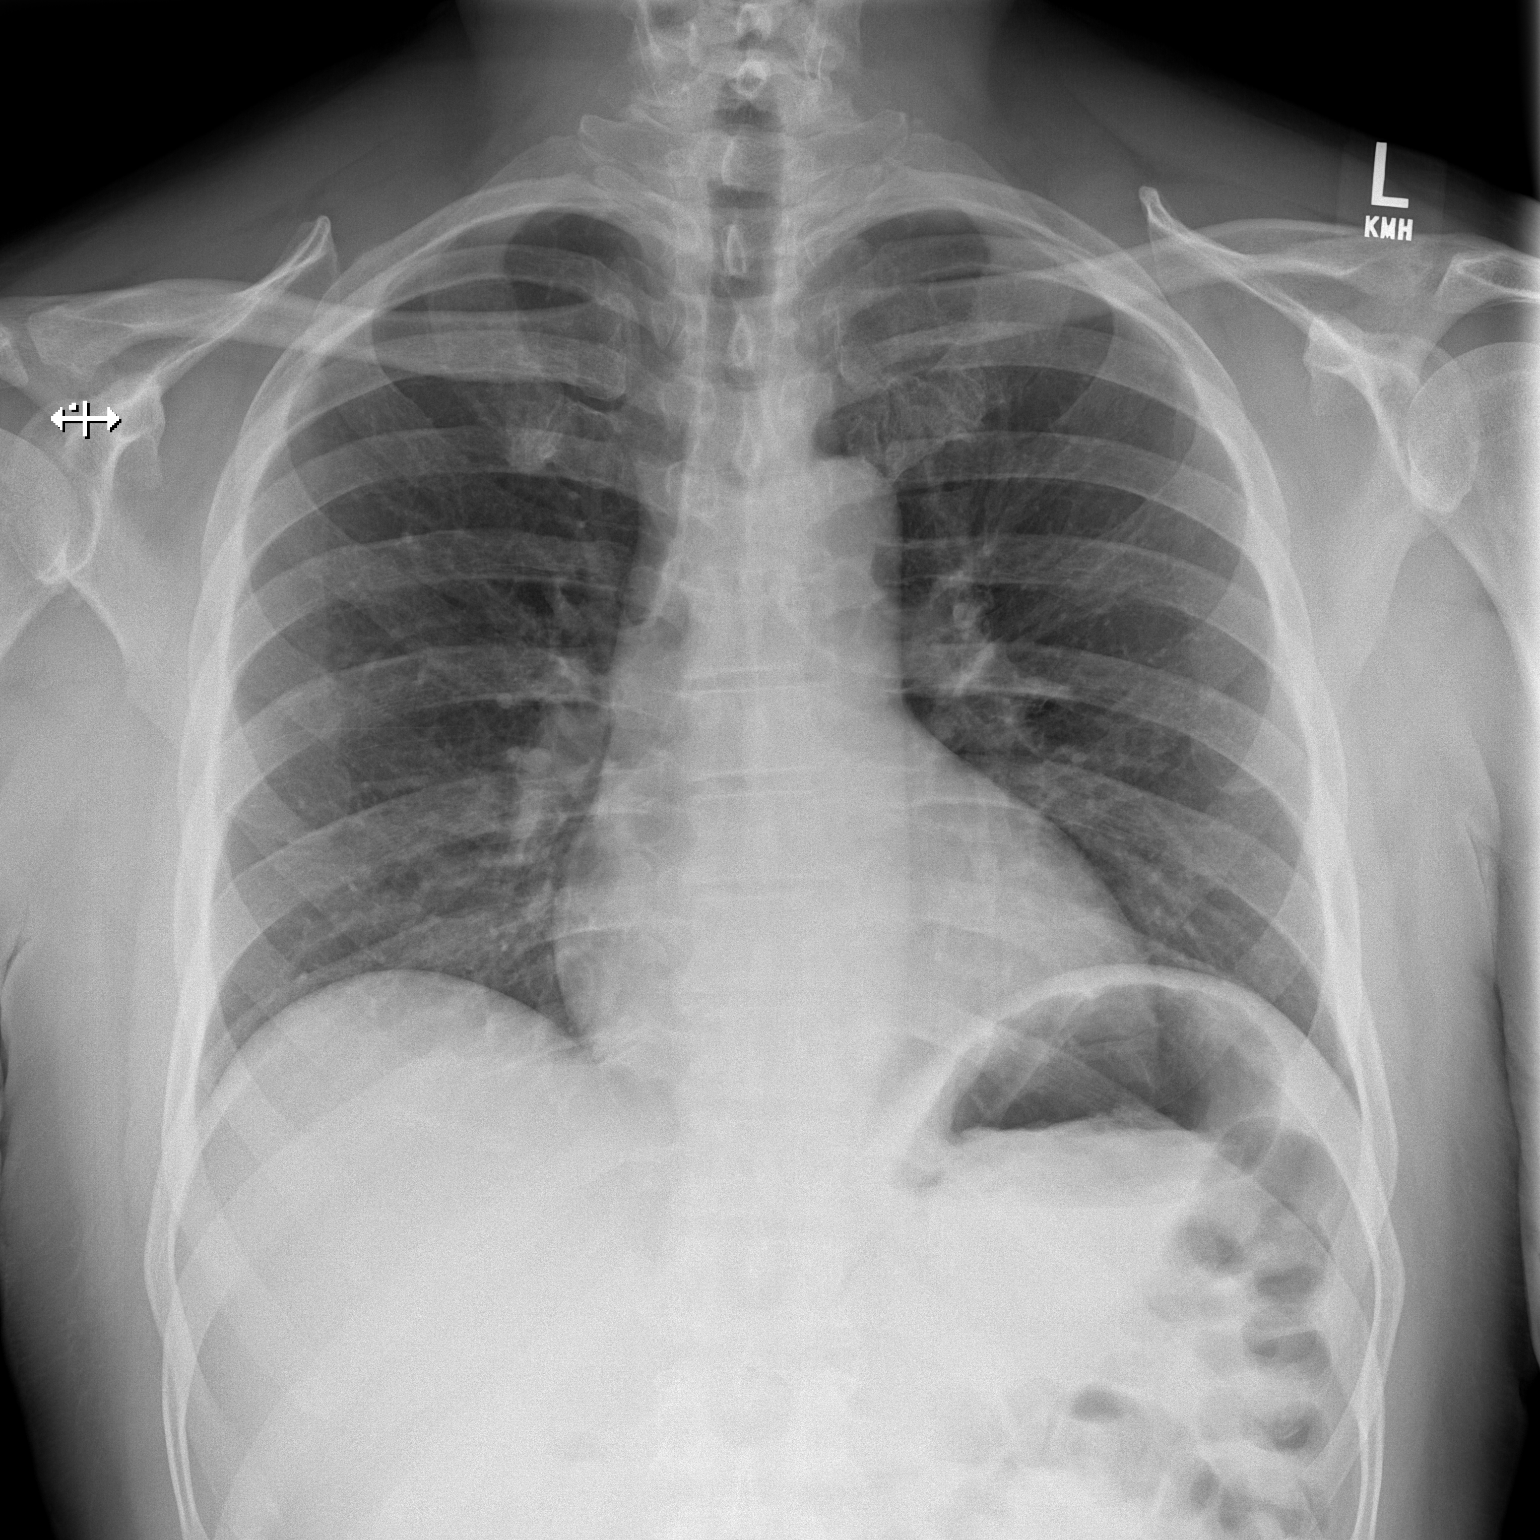

[w chest lat]
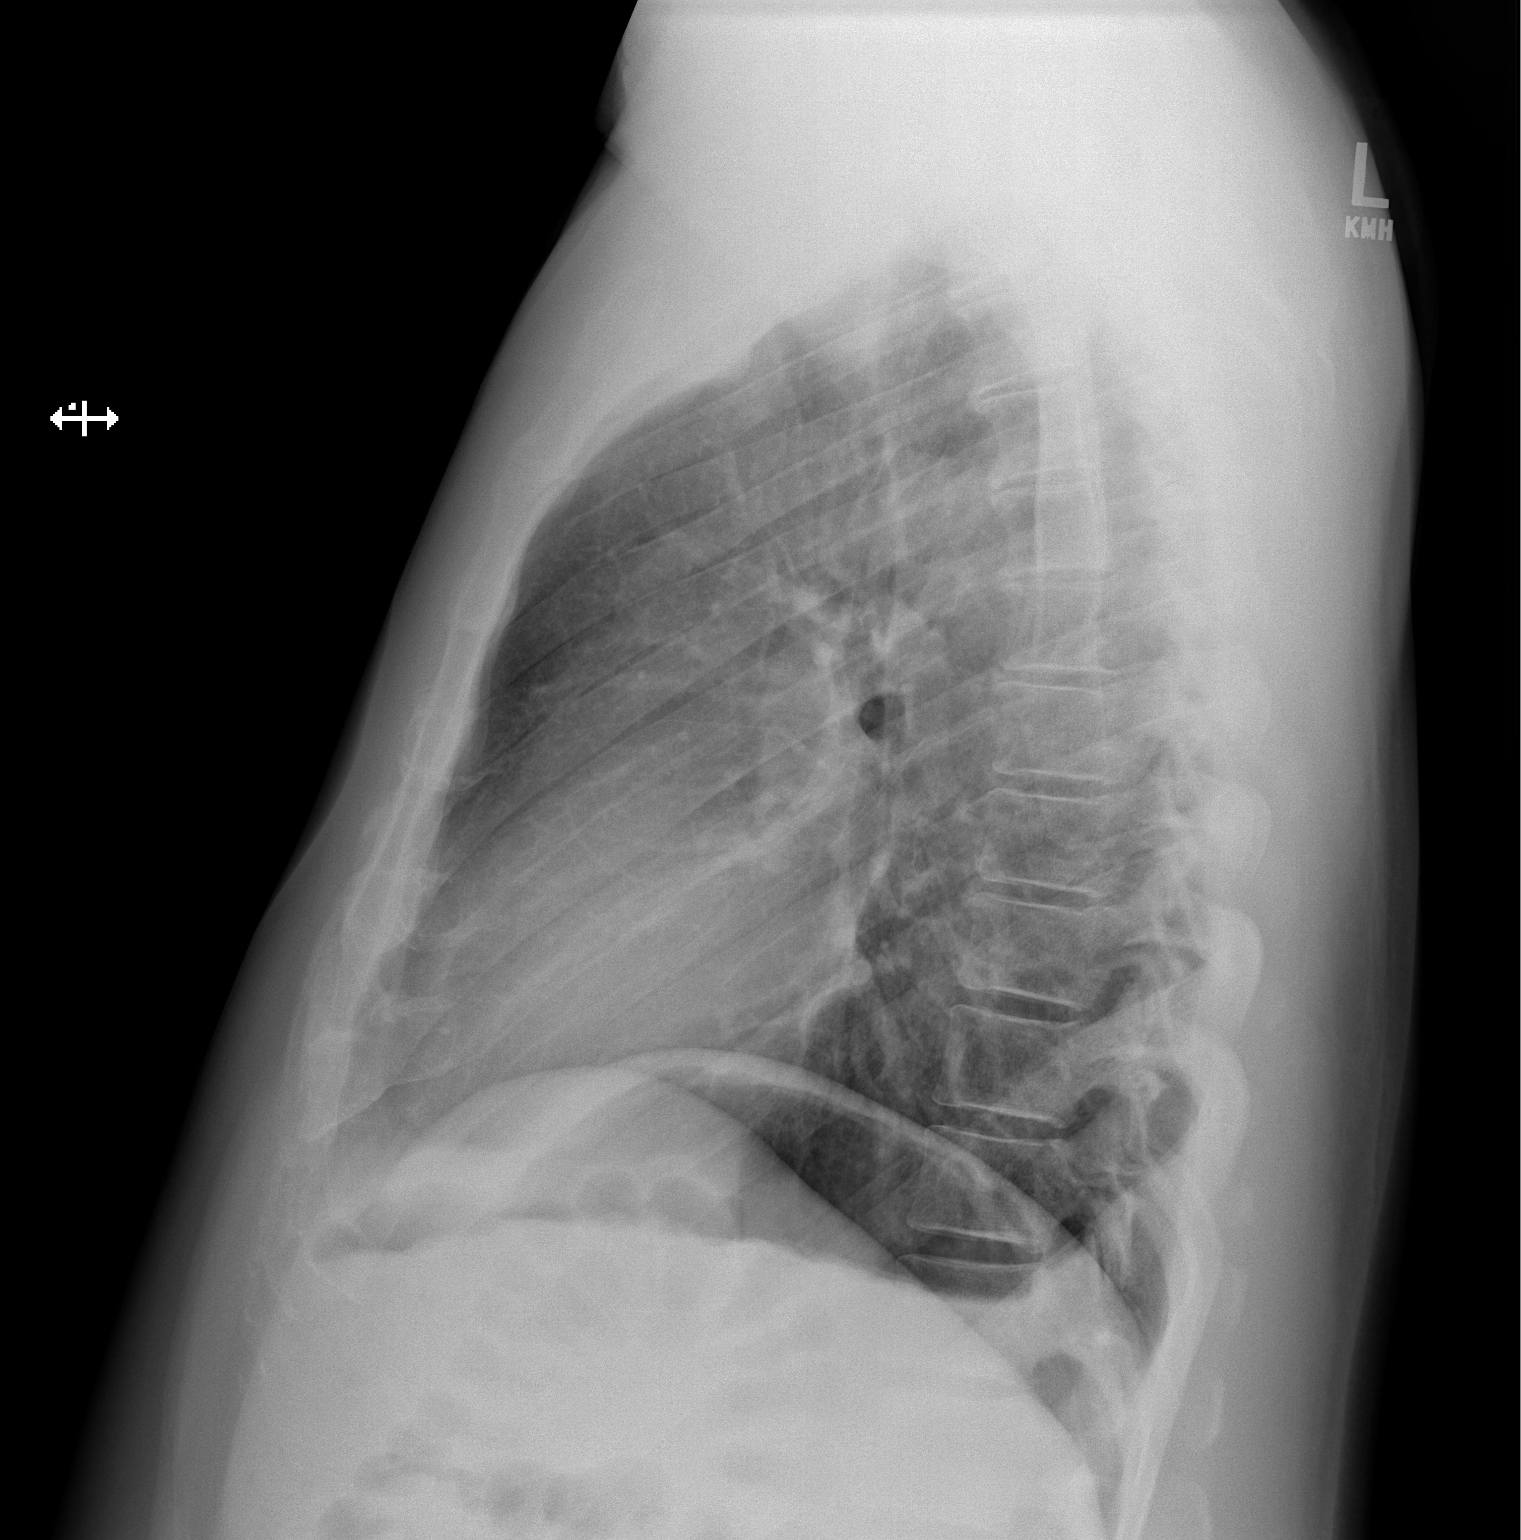

[2 of 2 positions shown; findings below may reference images not displayed]

FINDINGS: The cardiomediastinal silhouette is unremarkable.

There is no evidence of focal airspace disease, pulmonary edema,
suspicious pulmonary nodule/mass, pleural effusion, or pneumothorax.
No acute bony abnormalities are identified.
IMPRESSION: No active cardiopulmonary disease.

## 2019-12-02 ENCOUNTER — Encounter: Payer: Self-pay | Admitting: *Deleted

## 2019-12-06 ENCOUNTER — Encounter: Payer: Self-pay | Admitting: Neurology

## 2019-12-06 ENCOUNTER — Telehealth: Payer: Self-pay | Admitting: Neurology

## 2019-12-06 ENCOUNTER — Other Ambulatory Visit: Payer: Self-pay

## 2019-12-06 ENCOUNTER — Ambulatory Visit (INDEPENDENT_AMBULATORY_CARE_PROVIDER_SITE_OTHER): Payer: 59 | Admitting: Neurology

## 2019-12-06 VITALS — BP 133/89 | HR 64 | Ht 73.0 in | Wt 237.5 lb

## 2019-12-06 DIAGNOSIS — R42 Dizziness and giddiness: Secondary | ICD-10-CM | POA: Diagnosis not present

## 2019-12-06 DIAGNOSIS — R404 Transient alteration of awareness: Secondary | ICD-10-CM | POA: Diagnosis not present

## 2019-12-06 DIAGNOSIS — W19XXXA Unspecified fall, initial encounter: Secondary | ICD-10-CM | POA: Insufficient documentation

## 2019-12-06 DIAGNOSIS — W19XXXS Unspecified fall, sequela: Secondary | ICD-10-CM

## 2019-12-06 DIAGNOSIS — H814 Vertigo of central origin: Secondary | ICD-10-CM | POA: Diagnosis not present

## 2019-12-06 MED ORDER — VITAMIN B-12 1000 MCG/15ML PO LIQD: 1000.0000 ug | Freq: Every day | ORAL | 11 refills | Status: AC

## 2019-12-06 MED ORDER — TOPIRAMATE 100 MG PO TABS: 100.0000 mg | ORAL_TABLET | Freq: Two times a day (BID) | ORAL | 11 refills | Status: AC

## 2019-12-06 NOTE — Telephone Encounter (Addendum)
Noted opened in error. Pt arrived for his new patient appt.

## 2019-12-06 NOTE — Progress Notes (Signed)
HISTORICAL  George Monsanto Sr. is a 59 year old male, seen in request by his primary care physician Dr. Leavy Cella, Leanora Cover for evaluation of intermittent sudden onset severe vertigo, is accompanied by [name] at today's visit on December 06, 2019.  I reviewed and summarized the referring note.  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, delivering mail, he had sudden onset dizziness, actually fell to the ground, had transient loss of consciousness,  Since initial episode, it happened intermittently, sometimes twice a week, or can go weeks without any spells  When he first had the spells, he had CT head without contrast in November 2018 that was normal,  Per patient, he also wearing a box for cardiac monitoring for 1 month, that was reported normal, he did not have any spell during monitoring  He had multiple recurrent similar spells since, variable degree, typical spell lasts less than 5 minutes, no loss of consciousness, sudden onset intense dizziness, with mild holoacranial pressure headaches, he would just brace himself, lasting for few minutes symptoms would go away  Most recent spell was in June 2021, he was taking a lunch break sitting in the car, because his recent diagnosis of diabetes, he tried the pckled beet for the first time, 15 minutes later, while he was still at a sitting position, he had sudden onset severe vertigo, everything surrounding him was spinning, he felt nausea, no loss of consciousness, he waited for few minutes, but this time the symptoms lasted much longer, he was able to drive himself carefully back to his station in 10-15 minutes, was able to finish necessary interaction with his coworker, no confusion, no gait abnormality, he denies chest pain, chest pressure, no heart  palpitations  He did report slow onset left-sided hearing loss for more than 6 years,  Laboratory evaluation in May 2021 showed normal CMP, TSH, CBC, B12 276, A1c was mildly elevated 6.7,   REVIEW OF SYSTEMS: Full 14 system review of systems performed and notable only for as above All other review of systems were negative.  ALLERGIES: No Known Allergies  HOME MEDICATIONS: Current Outpatient Medications  Medication Sig Dispense Refill  . metFORMIN (GLUCOPHAGE) 500 MG tablet Take 1 tablet (500 mg total) 2 (two) times daily with a meal by mouth. 60 tablet 0  . naproxen sodium (ALEVE) 220 MG tablet Take 220 mg by mouth as needed.     No current facility-administered medications for this visit.    PAST MEDICAL HISTORY: Past Medical History:  Diagnosis Date  . Diabetes mellitus without complication ([facility])   . Dysequilibrium   . Muscle pain   . Vertigo     PAST SURGICAL HISTORY: Past Surgical History:  Procedure Laterality Date  . ACHILLES TENDON REPAIR Left     FAMILY HISTORY: Family History  Problem Relation Age of Onset  . Dementia Mother   . Stroke Father   . Diabetes Sister   . Prostate cancer Brother     SOCIAL HISTORY: Social History   Socioeconomic History  . Marital status: Divorced    Spouse name: Not on file  . Number of children: 3  . Years of education: 12+ - trade school  . Highest education level: Not on file  Occupational History  . 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 Smoking  status: Never Smoker  . Smokeless tobacco: Never Used  Substance and Sexual Activity  . Alcohol use: Yes    Comment: very rare  . Drug use: Never  . Sexual activity: Not on file  Other Topics Concern  . Not on file  Social History Narrative   Lives at home with his wife.   Right-handed.   One cup coffee per day.   Social Determinants of Health   Financial Resource Strain:   . Difficulty of Paying Living Expenses:   Food Insecurity:   . Worried About Community education officer in the Last Year:   . Barista in the Last Year:   Transportation Needs:   . Freight forwarder (Medical):   Marland Kitchen Lack of Transportation (Non-Medical):   Physical Activity:   . Days of Exercise per Week:   . Minutes of Exercise per Session:   Stress:   . Feeling of Stress :   Social Connections:   . Frequency of Communication with Friends and Family:   . Frequency of Social Gatherings with Friends and Family:   . Attends Religious Services:   . Active Member of Clubs or Organizations:   . Attends Banker Meetings:   Marland Kitchen Marital Status:   Intimate Partner Violence:   . Fear of Current or Ex-Partner:   . Emotionally Abused:   Marland Kitchen Physically Abused:   . Sexually Abused:      PHYSICAL EXAM   Vitals:   12/06/19 1122  BP: 133/89  Pulse: 64  Weight: 237 lb 8 oz (107.7 kg)  Height: 6\' 1"  (1.854 m)   Not recorded     Body mass index is 31.33 kg/m.  PHYSICAL EXAMNIATION:  Gen: NAD, conversant, well nourised, well groomed                     Cardiovascular: Regular rate rhythm, no peripheral edema, warm, nontender. Eyes: Conjunctivae clear without exudates or hemorrhage Neck: Supple, no carotid bruits. Pulmonary: Clear to auscultation bilaterally   NEUROLOGICAL EXAM:  MENTAL STATUS: Speech:    Speech is normal; fluent and spontaneous with normal comprehension.  Cognition:     Orientation to time, place and person     Normal recent and remote memory     Normal Attention span and concentration     Normal Language, naming, repeating,spontaneous speech     Fund of knowledge   CRANIAL NERVES: CN II: Visual fields are full to confrontation. Pupils are round equal and briskly reactive to light. CN III, IV, VI: extraocular movement are normal. No ptosis. CN V: Facial sensation is intact to light touch CN VII: Face is symmetric with normal eye closure  CN VIII: Decreased left side bony and air conduction CN IX, X: Phonation is normal. CN  XI: Head turning and shoulder shrug are intact  MOTOR: There is no pronator drift of out-stretched arms. Muscle bulk and tone are normal. Muscle strength is normal.  REFLEXES: Reflexes are 2+ and symmetric at the biceps, triceps, knees, and ankles. Plantar responses are flexor.  SENSORY: Intact to light touch, pinprick and vibratory sensation are intact in fingers and toes.  COORDINATION: There is no trunk or limb dysmetria noted.  GAIT/STANCE: Posture is normal. Gait is steady with normal steps, base, arm swing, and turning. Heel and toe walking are normal. Tandem gait is normal.  Romberg is absent.   DIAGNOSTIC DATA (LABS, IMAGING, TESTING) - I reviewed patient records, labs, notes, testing  and imaging myself where available.   ASSESSMENT AND PLAN  George Harkin Sr. is a 59 y.o. male   Recurrent episode of sudden onset of vertigo Left-sided sensorineural hearing loss  MRI of brain with without contrast to rule out structural lesion, such as CP angle pathology  Differentiation diagnoses also include migraine variant, even partial seizure  Topiramate titrating to 100 mg twice a day as preventive medications  EEG  B12 deficiency  Low normal range to 76  He could not tolerate tablets supplement, prescription for liquid supplement   Levert Feinstein, M.D. Ph.D.  [facility] [address], [address] [place], [place] [address] Ph: [phone] Fax: [phone]  CC:  Verlon Au, MD [address] [place],  [place] [address]

## 2019-12-14 ENCOUNTER — Telehealth: Payer: Self-pay | Admitting: Neurology

## 2019-12-14 NOTE — Telephone Encounter (Signed)
[employer] auth: NPR via uhc website/Cign[employer] order sent to GI. They will obtain the auth for cigna and reach out to the patient to schedule.

## 2019-12-30 ENCOUNTER — Telehealth: Payer: Self-pay | Admitting: Neurology

## 2019-12-30 NOTE — Telephone Encounter (Signed)
Pt wife-holmes, lisa(on DPR) is asking for something to be called in to help pt relax for his upcoming EEG.  Wife is asking that it be called into [facility] [phone]

## 2019-12-30 NOTE — Telephone Encounter (Signed)
I called the pt's wife back to further discuss this message.  Pt thought the EEG would take place in a similar setting like a MRI does and he has severe claustrophobia. I educated that the EEG takes place in a open space and he would not be closed in like a MRI.  She verbalized understanding. I also advised we typically do not rx meds to help pt's relax during this study since we are looking at brain wave activity and we need a clear representation. She verbalized understanding on this as well.

## 2020-01-03 ENCOUNTER — Ambulatory Visit
Admission: RE | Admit: 2020-01-03 | Discharge: 2020-01-03 | Disposition: A | Discharge status: Home / Self Care | Payer: 59 | Source: Ambulatory Visit | Attending: Neurology | Admitting: Neurology

## 2020-01-03 ENCOUNTER — Telehealth: Payer: Self-pay | Admitting: Neurology

## 2020-01-03 ENCOUNTER — Other Ambulatory Visit: Payer: Self-pay

## 2020-01-03 ENCOUNTER — Ambulatory Visit (INDEPENDENT_AMBULATORY_CARE_PROVIDER_SITE_OTHER): Payer: 59

## 2020-01-03 ENCOUNTER — Other Ambulatory Visit: Payer: Self-pay | Admitting: Neurology

## 2020-01-03 DIAGNOSIS — R42 Dizziness and giddiness: Secondary | ICD-10-CM

## 2020-01-03 DIAGNOSIS — R55 Syncope and collapse: Secondary | ICD-10-CM | POA: Diagnosis not present

## 2020-01-03 DIAGNOSIS — H814 Vertigo of central origin: Secondary | ICD-10-CM

## 2020-01-03 DIAGNOSIS — R404 Transient alteration of awareness: Secondary | ICD-10-CM

## 2020-01-03 DIAGNOSIS — W19XXXS Unspecified fall, sequela: Secondary | ICD-10-CM

## 2020-01-03 MED ORDER — ALPRAZOLAM 0.25 MG PO TABS: ORAL_TABLET | ORAL | 0 refills | Status: AC

## 2020-01-03 NOTE — Telephone Encounter (Signed)
Sent in xanax please let him know and review side effects, he should have someone drive him to and from the facility thanks

## 2020-01-03 NOTE — Telephone Encounter (Signed)
I returned the call. Pt has NKDA and verbalized understanding that he must have a driver. Rx for Xanax 1mg , per MRI protocol, sent to work-in MD for approval and authorization.

## 2020-01-03 NOTE — Telephone Encounter (Signed)
Pt is needing something called in for him to keep him calm during his MRI that he has scheduled today at 4pm. Pt is needing this called in to the [facility] on [address]. Please advise.

## 2020-01-03 NOTE — Telephone Encounter (Signed)
Both patient and wife informed.

## 2020-01-05 NOTE — Procedures (Signed)
    History:  George Rangel is a 59 year old gentleman with a history of intermittent severe vertigo.  The patient has had onset of dizziness episodes since 2016.  The patient has had some transient loss of consciousness with these events.  The episodes are occurring on average about twice a week.  He has been evaluated for these episodes.  This is a routine EEG.  No skull defects are noted.  Medications include Metformin and naproxen.  EEG classification: Normal awake  Description of the recording: The background rhythms of this recording consists of a fairly well modulated medium amplitude alpha rhythm of 11 Hz that is reactive to eye opening and closure. As the record progresses, the patient appears to remain in the waking state throughout the recording. Photic stimulation was performed, resulting in a bilateral and symmetric photic driving response. Hyperventilation was also performed, resulting in a minimal buildup of the background rhythm activities without significant slowing seen. At no time during the recording does there appear to be evidence of spike or spike wave discharges or evidence of focal slowing. EKG monitor shows no evidence of cardiac rhythm abnormalities with a heart rate of 78.  Impression: This is a normal EEG recording in the waking state. No evidence of ictal or interictal discharges are seen.

## 2020-01-06 NOTE — Progress Notes (Signed)
I spoke to the patient and provided him with the EEG results. He verbalized understanding of the findings. He will continue his medication as prescribed and keep his pending follow up.

## 2020-03-27 ENCOUNTER — Ambulatory Visit: Payer: POS | Admitting: Neurology

## 2020-03-27 ENCOUNTER — Encounter: Payer: Self-pay | Admitting: Neurology

## 2020-03-27 NOTE — Progress Notes (Deleted)
PATIENT: George Ginger Sr. DOB: Oct 18, 1960  REASON FOR VISIT: follow up HISTORY FROM: patient  HISTORY OF PRESENT ILLNESS: Today 03/27/20  HISTORY George Cayer Sr. is a 59 year old male, seen in request by his primary care physician Dr. Leavy Cella, George Rangel for evaluation of intermittent sudden onset severe vertigo, is accompanied by his wife George Rangel at today's visit on December 06, 2019.  I reviewed and summarized the referring note.  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, delivering mail, he had sudden onset dizziness, actually fell to the ground, had transient loss of consciousness,  Since initial episode, it happened intermittently, sometimes twice a week, or can go weeks without any spells  When he first had the spells, he had CT head without contrast in November 2018 that was normal,  Per patient, he also wearing a box for cardiac monitoring for 1 month, that was reported normal, he did not have any spell during monitoring  He had multiple recurrent similar spells since, variable degree, typical spell lasts less than 5 minutes, no loss of consciousness, sudden onset intense dizziness, with mild holoacranial pressure headaches, he would just brace himself, lasting for few minutes symptoms would go away  Most recent spell was in June 2021, he was taking a lunch break sitting in the car, because his recent diagnosis of diabetes, he tried the pckled beet for the first time, 15 minutes later, while he was still at a sitting position, he had sudden onset severe vertigo, everything surrounding him was spinning, he felt nausea, no loss of consciousness, he waited for few minutes, but this time the symptoms lasted much longer, he was able to drive himself carefully back to his station in 10-15  minutes, was able to finish necessary interaction with his coworker, no confusion, no gait abnormality, he denies chest pain, chest pressure, no heart palpitations  He did report slow onset left-sided hearing loss for more than 6 years,  Laboratory evaluation in May 2021 showed normal CMP, TSH, CBC, B12 276, A1c was mildly elevated 6.7,  Update March 27, 2020 SS:   REVIEW OF SYSTEMS: Out of a complete 14 system review of symptoms, the patient complains only of the following symptoms, and all other reviewed systems are negative.  ALLERGIES: No Known Allergies  HOME MEDICATIONS: Outpatient Medications Prior to Visit  Medication Sig Dispense Refill  . ALPRAZolam (XANAX) 0.25 MG tablet Take 1-2 tabs (0.25mg -0.50mg ) 30-60 minutes before procedure. May repeat if needed.Do not drive. 4 tablet 0  . Cyanocobalamin (VITAMIN B-12) 1000 MCG/15ML LIQD Take 15 mLs (1,000 mcg total) by mouth daily. 450 mL 11  . metFORMIN (GLUCOPHAGE) 500 MG tablet Take 1 tablet (500 mg total) 2 (two) times daily with a meal by mouth. 60 tablet 0  . naproxen sodium (ALEVE) 220 MG tablet Take 220 mg by mouth as needed.    . topiramate (TOPAMAX) 100 MG tablet Take 1 tablet (100 mg total) by mouth 2 (two) times daily. 60 tablet 11   No facility-administered medications prior to visit.    PAST MEDICAL HISTORY: Past Medical History:  Diagnosis Date  . Diabetes mellitus without complication (HCC)   . Dysequilibrium   . Muscle pain   . Vertigo     PAST SURGICAL HISTORY: Past Surgical History:  Procedure Laterality Date  .  ACHILLES TENDON REPAIR Left     FAMILY HISTORY: Family History  Problem Relation Age of Onset  . Dementia Mother   . Stroke Father   . Diabetes Sister   . Prostate cancer Brother     SOCIAL HISTORY: Social History   Socioeconomic History  . Marital status: Married    Spouse name: Not on file  . Number of children: 3  . Years of education: 12+ - trade school  . Highest education  level: Not on file  Occupational History  . 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 Smoking status: Never Smoker  . Smokeless tobacco: Never Used  Substance and Sexual Activity  . Alcohol use: Yes    Comment: very rare  . Drug use: Never  . Sexual activity: Not on file  Other Topics Concern  . Not on file  Social History Narrative   Lives at home with his wife.   Right-handed.   One cup coffee per day.   Social Determinants of Health   Financial Resource Strain:   . Difficulty of Paying Living Expenses: Not on file  Food Insecurity:   . Worried About Programme researcher, broadcasting/film/video in the Last Year: Not on file  . Ran Out of Food in the Last Year: Not on file  Transportation Needs:   . Lack of Transportation (Medical): Not on file  . Lack of Transportation (Non-Medical): Not on file  Physical Activity:   . Days of Exercise per Week: Not on file  . Minutes of Exercise per Session: Not on file  Stress:   . Feeling of Stress : Not on file  Social Connections:   . Frequency of Communication with Friends and Family: Not on file  . Frequency of Social Gatherings with Friends and Family: Not on file  . Attends Religious Services: Not on file  . Active Member of Clubs or Organizations: Not on file  . Attends Banker Meetings: Not on file  . Marital Status: Not on file  Intimate Partner Violence:   . Fear of Current or Ex-Partner: Not on file  . Emotionally Abused: Not on file  . Physically Abused: Not on file  . Sexually Abused: Not on file      PHYSICAL EXAM  There were no vitals filed for this visit. There is no height or weight on file to calculate BMI.  Generalized: Well developed, in no acute distress   Neurological examination  Mentation: Alert oriented to time, place, history taking. Follows all commands speech and language fluent Cranial nerve II-XII: Pupils were equal round reactive to light. Extraocular movements were full, visual field were full on  confrontational test. Facial sensation and strength were normal. Uvula tongue midline. Head turning and shoulder shrug  were normal and symmetric. Motor: The motor testing reveals 5 over 5 strength of all 4 extremities. Good symmetric motor tone is noted throughout.  Sensory: Sensory testing is intact to soft touch on all 4 extremities. No evidence of extinction is noted.  Coordination: Cerebellar testing reveals good finger-nose-finger and heel-to-shin bilaterally.  Gait and station: Gait is normal. Tandem gait is normal. Romberg is negative. No drift is seen.  Reflexes: Deep tendon reflexes are symmetric and normal bilaterally.   DIAGNOSTIC DATA (LABS, IMAGING, TESTING) - I reviewed patient records, labs, notes, testing and imaging myself where available.  Lab Results  Component Value Date   WBC 8.0 04/03/2017   HGB 13.8 04/03/2017   HCT 41.4 04/03/2017   MCV  87.0 04/03/2017   PLT 344 04/03/2017      Component Value Date/Time   NA 136 04/03/2017 1940   K 3.7 04/03/2017 1940   CL 101 04/03/2017 1940   CO2 27 04/03/2017 1940   GLUCOSE 244 (H) 04/03/2017 1940   BUN 18 04/03/2017 1940   CREATININE 1.03 04/03/2017 1940   CALCIUM 8.7 (L) 04/03/2017 1940   PROT 7.2 04/03/2017 1940   ALBUMIN 3.7 04/03/2017 1940   AST 20 04/03/2017 1940   ALT 29 04/03/2017 1940   ALKPHOS 57 04/03/2017 1940   BILITOT 0.8 04/03/2017 1940   GFRNONAA >60 04/03/2017 1940   GFRAA >60 04/03/2017 1940   No results found for: CHOL, HDL, LDLCALC, LDLDIRECT, TRIG, CHOLHDL No results found for: UYQI3K No results found for: VITAMINB12 No results found for: TSH    ASSESSMENT AND PLAN 59 y.o. year old male  has a past medical history of Diabetes mellitus without complication (HCC), Dysequilibrium, Muscle pain, and Vertigo. here with:  1.  Recurrent episode of sudden onset of vertigo 2.  Left-sided sensorineural hearing loss -EEG in August 2021 was normal -MRI of the brain with and without contrast was  ordered, but is yet to be completed  3.  B12 deficiency -Could not tolerate tablet supplement, on liquid supplement  I spent 15 minutes with the patient. 50% of this time was spent   George Rangel, [place], DNP 03/27/2020, 5:37 AM [facility] [address] [place], [place] [address] [phone]

## 2023-09-29 NOTE — Progress Notes (Signed)
 Chief Complaint: This patient is seen in follow up   Fall Screening     History of Present Illness: George Rangel is a 63 y.o. 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  Also has concerns of some intermittent perineal itching in the setting of constipation and hemorrhoids.   Recently was on Janumet with urinary frequency that improved after stopping this.  His fluids include 2 cups coffee and 3 12 ounce of water bottles most days.  During the summer he will drink more.  He does struggle with constipation.  He has had erectile dysfunction and was nervous to pick this up with his PCP and presents here today.  He does not smoke or drink.  Denies B symptoms.  No fevers chills nausea vomiting chest pain shortness of breath headache vision changes.   PSA November 2024 2.24.  Plan for management of constipation, monitoring PSA with family history, tight glycemic control, and revatio for ED.   09/29/2023: Last A1C 7.0 08/18/2023 improved from 8.6 02/04/2023 Tells me that his energy fades over the course the day.  He did a sleep study and tested positive for sleep apnea; he never ended up getting a CPAP machine.  He is taking Metamucil and still struggles with constipation some.  His urinary frequency is totally resolved.  He has not tried the sildenafil yet because at night he is tired and is not thinking about intimacy.  Using caffeine during the daytime including occasional 5-hour energies, then using Aleve sleep aid at night presumably with diphenhydramine.  Nocturia X1.  Please see dictation for additional details.   Current Outpatient Medications:  .  Lancets misc, Check BG daily, Disp: 50 each, Rfl: 11 .  lancing device  with lancets kit, FSBS daily, Disp: 50 each, Rfl: 11 .  OneTouch Verio IQ Meter kit, Use as instructed, Disp: 1 each, Rfl: 0 .  OneTouch Verio test strips test strip, Check BG daily, Disp: 50 strip, Rfl: 11 .  sAXagliptin-metFORMIN  5-1,000 mg TM24, Take 1 tablet by mouth daily., Disp: 90 tablet, Rfl: 1 .  sildenafiL (REVATIO) 20 mg tablet, Take 2 - 5 tablets (40 mg - 100 mg) as one single dose at least 60 minutes prior to anticipated sexual activity to achieve erection. Do not use more than once per day. Do not take nitroglycerin after using this medication., Disp: 20 tablet, Rfl: 1   No Known Allergies  Past Medical History:  Diagnosis Date  . Depression   . Diabetes mellitus type II, controlled    (CMD)    prediabetic     Past Surgical History:  Procedure Laterality Date  . FOOT SURGERY     Procedure: FOOT SURGERY     Family History  Problem Relation Name Age of Onset  . Dementia Mother    . Stroke Father    . Heart failure Sister    . Stroke Sister    . Cancer Brother         Prostate cancer  . Early death Paternal Grandfather    . Eczema Other    . Psoriasis Other    . Diabetes Other           Vitals:  09/29/23 0830  BP: 145/82  Pulse:   SpO2:     There is no height or weight on file to calculate BMI.  ROS  See Assessment and HPI.  Genitourinary:  As per HPI.  Physical Exam  Physical Exam Constitutional:      General: He is not in acute distress.    Appearance: He is not ill-appearing, toxic-appearing or diaphoretic.  HENT:     Head: Normocephalic.     Right Ear: External ear normal.     Left Ear: External ear normal.  Eyes:     General: No scleral icterus.    Conjunctiva/sclera: Conjunctivae normal.  Cardiovascular:     Comments: No cyanosis Pulmonary:     Effort: Pulmonary effort is normal. No respiratory distress.     Comments: Speaking complete sentences without accessory muscle usage or retractions Abdominal:     General: There is  no distension.  Musculoskeletal:        General: Normal range of motion.  Skin:    General: Skin is warm and dry.  Neurological:     Mental Status: He is alert and oriented to person, place, and time. Mental status is at baseline.  Psychiatric:        Mood and Affect: Mood normal.        Behavior: Behavior normal.      Assesment :   See HPI  Discussed the following plan:  1. Family history of prostate cancer      2. Erectile dysfunction, unspecified erectile dysfunction type      3. Type 2 diabetes mellitus with other specified complication, without long-term current use of insulin    (CMD)      4. Constipation, unspecified constipation type      5. Urinary frequency      6. Sleep apnea in adult        Advised the following  Return November for check in and repeat PSA. Has not tried sildenafil yet.  We discussed his energy overall and interest in intimacy may increase with use of CPAP machine.  He tells me he is going to follow-up on this. We discussed he may need less caffeine if sleeping correctly managing his sleep apnea.  Recommended cessation in the afternoon to prevent interrupting sleep. He is on Metamucil for his constipation.  We discussed he needs to drink adequate water for this to work appropriately.  He does have history of hemorrhoids and if adequately hydrated on Metamucil this can often help in 90% of cases with some resolution. Urinary frequency resolved.  Associated with Janumet. Congratulated on his improvement of his A1c and encouraged him to continue tight diabetic management. No orders of the defined types were placed in this encounter.   There are no Patient Instructions on file for this visit.  Return for PSA and follow up in November..   Electronically signed by: Seena Maranda Mail III, PA-C 09/29/2023 8:54 AM

## 2023-12-14 ENCOUNTER — Ambulatory Visit
Admission: EM | Admit: 2023-12-14 | Discharge: 2023-12-14 | Disposition: A | Discharge status: Home / Self Care | Attending: Nurse Practitioner | Admitting: Nurse Practitioner

## 2023-12-14 DIAGNOSIS — T63441A Toxic effect of venom of bees, accidental (unintentional), initial encounter: Secondary | ICD-10-CM | POA: Diagnosis not present

## 2023-12-14 MED ORDER — PREDNISONE 20 MG PO TABS: 40.0000 mg | ORAL_TABLET | Freq: Every day | ORAL | 0 refills | Status: AC

## 2023-12-14 NOTE — ED Triage Notes (Signed)
 Patient to Urgent Care with complaints of a bee sting to his left ankle (swollen/ feels tight). Concerned that the stinger may still be in his ankle.  Symptoms x2 days. Taking aleve/ icing the area.

## 2023-12-14 NOTE — Discharge Instructions (Signed)
 You were seen today for swelling, tightness, and itching at the site of a bee sting sustained two days ago. Your symptoms are consistent with a localized allergic reaction, and there are no signs of infection or retained stinger. You were prescribed a five-day course of prednisone  to reduce inflammation. Be aware that prednisone  may cause temporary increases in blood sugar, so monitor your levels since you have diabetes. To manage symptoms at home, keep your foot elevated to heart level using a pillow under the ankle, and apply ice packs for 10-15 minutes at a time several times a day, making sure to place a cloth between the ice and your skin. You may take over-the-counter Benadryl as needed for itching. Monitor the area for signs of infection, including increased redness, warmth, drainage, or spreading swelling. Seek emergency care if you develop difficulty breathing, chest tightness, swelling of the face or throat, or severe dizziness, as these may be signs of a serious allergic reaction.

## 2023-12-14 NOTE — ED Provider Notes (Signed)
 George Rangel    CSN: [id number] Arrival date & time: 12/14/23  1241      History   Chief Complaint Chief Complaint  Patient presents with   Insect Bite    HPI George Ramsburg Sr. is a 63 y.o. male.   Discussed the use of AI scribe software for clinical note transcription with the patient, who gave verbal consent to proceed.   Patient presents with a bee sting to the left foot that occurred on Friday around 12:30 PM, approximately 2 days ago. 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 Since the incident, the affected area has started to swell, feeling tight and itchy. The patient reports a strong urge to scratch the site. Following the sting, the patient has been staying at home, using a recliner and applying ice to the affected area. They attempted to remove the stinger using a credit card but stopped when it proved ineffective. The patient denies any signs of infection such as redness or heat at the site.   The following portions of the patient's history were reviewed and updated as appropriate: allergies, current medications, past family history, past medical history, past social history, past surgical history, and problem list.    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 Authorizing Provider  predniSONE  (DELTASONE ) 20 MG tablet Take 2 tablets (40 mg total) by mouth daily for 5 days. 12/14/23 12/19/23 Yes Iola Lukes, FNP  ALPRAZolam  (XANAX ) 0.25 MG tablet Take 1-2 tabs (0.25mg -0.50mg ) 30-60 minutes before procedure. May repeat if needed.Do not drive. 01/03/20   Ines Onetha NOVAK, MD  Cyanocobalamin (VITAMIN B-12) 1000 MCG/15ML LIQD Take 15 mLs (1,000 mcg total) by mouth daily. 12/06/19   Onita Duos, MD  metFORMIN  (GLUCOPHAGE ) 500 MG tablet Take 1 tablet (500 mg total) 2 (two) times daily with a meal by mouth. 04/04/17   Lynwood Anes, MD  naproxen sodium (ALEVE) 220 MG tablet Take 220 mg by mouth as needed.    [provider]  topiramate  (TOPAMAX ) 100 MG tablet Take 1 tablet (100 mg total) by mouth 2 (two) times daily. 12/06/19   Onita Duos, MD    Family History Family History  Problem Relation Age of Onset   Dementia Mother    Stroke Father    Diabetes Sister    Prostate cancer Brother     Social History Social History   Tobacco Use   Smoking status: Never   Smokeless tobacco: Never  Substance Use Topics   Alcohol use: Yes    Comment: very rare   Drug use: Never     Allergies   Patient has no known allergies.   Review of Systems Review of Systems  Constitutional:  Negative for fever.  Musculoskeletal:  Negative for gait problem.  Skin:  Positive for wound.  Neurological:  Negative for weakness and numbness.  All other systems  reviewed and are negative.    Physical Exam Triage Vital Signs ED Triage Vitals  Encounter Vitals Group     BP 12/14/23 1257 139/86     Girls Systolic BP Percentile --      Girls Diastolic BP Percentile --      Boys Systolic BP Percentile --      Boys Diastolic BP Percentile --      Pulse Rate 12/14/23 1257 81     Resp 12/14/23 1257 18     Temp 12/14/23 1257 98 F (36.7 C)     Temp src --      SpO2 12/14/23 1257 99 %     Weight --      Height --      Head Circumference --      Peak Flow --      Pain Score 12/14/23 1250 2     Pain Loc --      Pain Education --      Exclude from Growth Chart --    No data found.  Updated Vital  Signs BP 139/86   Pulse 81   Temp 98 F (36.7 C)   Resp 18   SpO2 99%   Visual Acuity Right Eye Distance:   Left Eye Distance:   Bilateral Distance:    Right Eye Near:   Left Eye Near:    Bilateral Near:     Physical Exam Vitals reviewed.  Constitutional:      General: He is awake. He is not in acute distress.    Appearance: Normal appearance. He is well-developed. He is not ill-appearing, toxic-appearing or diaphoretic.  HENT:     Head: Normocephalic.     Right Ear: Hearing normal.     Left Ear: Hearing normal.     Nose: Nose normal.     Mouth/Throat:     Mouth: Mucous membranes are moist.  Eyes:     General: Vision grossly intact.     Conjunctiva/sclera: Conjunctivae normal.  Cardiovascular:     Rate and Rhythm: Normal rate and regular rhythm.     Heart sounds: Normal heart sounds.  Pulmonary:     Effort: Pulmonary effort is normal.     Breath sounds: Normal breath sounds and air entry.  Musculoskeletal:        General: Normal range of motion.     Cervical back: Full passive range of motion without pain, normal range of motion and neck supple.  Skin:    General: Skin is warm and dry.     Comments: Pinpoint bite mark wound to the lateral aspect of the left ankle. There is associated swelling involving the ankle and foot. No erythema, drainage, or warmth is noted. Pedal pulses are palpable and intact. Full range of motion of the foot and ankle without restriction. Sensation is intact distally, and the extremity is neurovascularly intact.  Neurological:     General: No focal deficit present.     Mental Status: He is alert and oriented to person, place, and time.  Psychiatric:        Speech: Speech normal.        Behavior: Behavior is cooperative.         UC Treatments / Results  Labs (all labs ordered are listed, but only abnormal results are displayed) Labs Reviewed - No data to display  EKG   Radiology No results found.  Procedures Procedures  (including critical care time)  Medications Ordered in UC Medications - No  data to display  Initial Impression / Assessment and Plan / UC Course  I have reviewed the triage vital signs and the nursing notes.  Pertinent labs & imaging results that were available during my care of the patient were reviewed by me and considered in my medical decision making (see chart for details).     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 Examination shows localized swelling without erythema or warmth, consistent with a non-infectious allergic reaction. No visible stinger is present, and given the time elapsed without signs of infection, retained stinger is unlikely. Patient's diabetes is well-controlled on oral medications, with last HbA1c at 7% in March. A five-day course of prednisone  was prescribed to reduce inflammation, with counseling on the potential for temporary blood sugar elevation. Supportive care includes elevating the foot to heart level with a pillow, applying ice with a barrier to the skin, and using over-the-counter Benadryl for itching. Patient was advised to monitor for signs of infection such as redness, warmth, or drainage, and to follow up if symptoms worsen or those signs develop.  Today's evaluation has revealed no signs of a dangerous process. Discussed diagnosis with patient and/or guardian. Patient and/or guardian aware of their diagnosis, possible red flag symptoms to watch out for and need for close follow up. Patient and/or guardian understands verbal and written discharge instructions. Patient and/or guardian comfortable with plan and disposition.  Patient and/or guardian has a clear mental status at this time, good insight into illness (after discussion and teaching) and has clear judgment to make decisions regarding their care  Documentation was completed with the aid of voice recognition  software. Transcription may contain typographical errors. Final Clinical Impressions(s) / UC Diagnoses   Final diagnoses:  Local reaction to bee sting, accidental or unintentional, initial encounter     Discharge Instructions      You were seen today for swelling, tightness, and itching at the site of a bee sting sustained two days ago. Your symptoms are consistent with a localized allergic reaction, and there are no signs of infection or retained stinger. You were prescribed a five-day course of prednisone  to reduce inflammation. Be aware that prednisone  may cause temporary increases in blood sugar, so monitor your levels since you have diabetes. To manage symptoms at home, keep your foot elevated to heart level using a pillow under the ankle, and apply ice packs for 10-15 minutes at a time several times a day, making sure to place a cloth between the ice and your skin. You may take over-the-counter Benadryl as needed for itching. Monitor the area for signs of infection, including increased redness, warmth, drainage, or spreading swelling. Seek emergency care if you develop difficulty breathing, chest tightness, swelling of the face or throat, or severe dizziness, as these may be signs of a serious allergic reaction.      ED Prescriptions     Medication Sig Dispense Auth. Provider   predniSONE  (DELTASONE ) 20 MG tablet Take 2 tablets (40 mg total) by mouth daily for 5 days. 10 tablet Iola Lukes, FNP      PDMP not reviewed this encounter.   Iola Lukes, [place] 12/14/23 1350
